# Patient Record
Sex: Male | Born: 1985 | Race: Black or African American | Hispanic: No | Marital: Married | Smoking: Current every day smoker
Health system: Southern US, Community
[De-identification: ages and names within clinical notes are randomized; demographics above are authoritative.]

## PROBLEM LIST (undated history)

## (undated) DIAGNOSIS — J45909 Unspecified asthma, uncomplicated: Secondary | ICD-10-CM

## (undated) DIAGNOSIS — G43909 Migraine, unspecified, not intractable, without status migrainosus: Secondary | ICD-10-CM

---
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
## 2008-09-30 ENCOUNTER — Emergency Department: Payer: Self-pay | Admitting: Emergency Medicine

## 2008-10-19 ENCOUNTER — Emergency Department: Payer: Self-pay | Admitting: Internal Medicine

## 2012-05-23 ENCOUNTER — Emergency Department: Payer: Self-pay | Admitting: Emergency Medicine

## 2012-08-01 ENCOUNTER — Emergency Department: Payer: Self-pay | Admitting: Emergency Medicine

## 2013-10-24 ENCOUNTER — Emergency Department: Payer: Self-pay | Admitting: Emergency Medicine

## 2013-11-09 ENCOUNTER — Emergency Department: Payer: Self-pay | Admitting: Emergency Medicine

## 2014-09-15 ENCOUNTER — Emergency Department
Admission: EM | Admit: 2014-09-15 | Discharge: 2014-09-15 | Disposition: A | Discharge status: Home / Self Care | Payer: Self-pay

## 2014-09-16 ENCOUNTER — Emergency Department
Admission: EM | Admit: 2014-09-16 | Discharge: 2014-09-16 | Disposition: A | Discharge status: Home / Self Care | Payer: PRIVATE HEALTH INSURANCE | Attending: Emergency Medicine | Admitting: Emergency Medicine

## 2014-09-16 ENCOUNTER — Encounter: Payer: Self-pay | Admitting: Emergency Medicine

## 2014-09-16 DIAGNOSIS — L259 Unspecified contact dermatitis, unspecified cause: Secondary | ICD-10-CM | POA: Insufficient documentation

## 2014-09-16 DIAGNOSIS — Z720 Tobacco use: Secondary | ICD-10-CM | POA: Insufficient documentation

## 2014-09-16 MED ORDER — HYDROXYZINE PAMOATE 25 MG PO CAPS: 25.0000 mg | ORAL_CAPSULE | Freq: Three times a day (TID) | ORAL | Status: AC | PRN

## 2014-09-16 MED ORDER — METHYLPREDNISOLONE SODIUM SUCC 125 MG IJ SOLR: 125.0000 mg | Freq: Once | INTRAMUSCULAR | Status: DC

## 2014-09-16 MED ORDER — PREDNISONE 10 MG PO TABS: ORAL_TABLET | ORAL | Status: DC

## 2014-09-16 MED ORDER — METHYLPREDNISOLONE SODIUM SUCC 125 MG IJ SOLR
INTRAMUSCULAR | Status: AC
  Administered 2014-09-16: 125 mg via INTRAMUSCULAR
  Filled 2014-09-16: qty 2

## 2014-09-16 MED ORDER — METHYLPREDNISOLONE SODIUM SUCC 125 MG IJ SOLR
125.0000 mg | Freq: Once | INTRAMUSCULAR | Status: AC
  Administered 2014-09-16: 125 mg via INTRAMUSCULAR

## 2014-09-16 MED ORDER — SODIUM CHLORIDE 0.9 % IV SOLN: 250.0000 mg | Freq: Once | INTRAVENOUS | Status: DC

## 2014-09-16 MED ORDER — RANITIDINE HCL 150 MG PO TABS: 150.0000 mg | ORAL_TABLET | Freq: Two times a day (BID) | ORAL | Status: AC

## 2014-09-16 NOTE — ED Provider Notes (Signed)
[facility] Emergency Department Provider Note   ____________________________________________  Time seen: Approximately 7:22 AM  I have reviewed the triage vital signs and the nursing notes.   HISTORY  Chief Complaint Rash    HPI Frank Hendricks is a 29 y.o. male since with complaints of rash on both his arms 2-3 days ago. States he has been working out in the yard pulling a lot of weeds.   No past medical history on file.  There are no active problems to display for this patient.   No past surgical history on file.  Current Outpatient Rx  Name  Route  Sig  Dispense  Refill  . hydrOXYzine (VISTARIL) 25 MG capsule   Oral   Take 1 capsule (25 mg total) by mouth 3 (three) times daily as needed.   30 capsule   0   . predniSONE (DELTASONE) 10 MG tablet      Take 6 tabs BID x 2 days, then decrease by 1 tablet every 2 days.   42 tablet   0   . ranitidine (ZANTAC) 150 MG tablet   Oral   Take 1 tablet (150 mg total) by mouth 2 (two) times daily.   14 tablet   1     Allergies Review of patient's allergies indicates no known allergies.  No family history on file.  Social History History  Substance Use Topics  . Smoking status: Current Every Day Smoker -- 1.00 packs/day for 12 years    Types: Cigarettes  . Smokeless tobacco: Not on file  . Alcohol Use: No    Review of Systems Constitutional: No fever/chills Eyes: No visual changes. ENT: No sore throat. Cardiovascular: Denies chest pain. Respiratory: Denies shortness of breath. Gastrointestinal: No abdominal pain.  No nausea, no vomiting.  No diarrhea.  No constipation. Genitourinary: Negative for dysuria. Musculoskeletal: Negative for back pain. Skin: Negative for bilateral vesicular rash noted on both arms. Neurological: Negative for headaches, focal weakness or numbness.  10-point ROS otherwise negative.  ____________________________________________   PHYSICAL  EXAM:  VITAL SIGNS: ED Triage Vitals  Enc Vitals Group     BP 09/16/14 0241 124/79 mmHg     Pulse Rate 09/16/14 0241 88     Resp 09/16/14 0241 18     Temp 09/16/14 0241 97.8 F (36.6 C)     Temp Source 09/16/14 0241 Oral     SpO2 09/16/14 0241 100 %     Weight 09/16/14 0241 170 lb (77.111 kg)     Height 09/16/14 0241  (1.778 m)     Head Cir --      Peak Flow --      Pain Score --      Pain Loc --      Pain Edu? --      Excl. in GC? --     Constitutional: Alert and oriented. Well appearing and in no acute distress. Eyes: Conjunctivae are normal. PERRL. EOMI. Head: Atraumatic. Nose: No congestion/rhinnorhea. Mouth/Throat: Mucous membranes are moist.  Oropharynx non-erythematous. Neck: No stridor.   Cardiovascular: Normal rate, regular rhythm. Grossly normal heart sounds.  Good peripheral circulation. Respiratory: Normal respiratory effort.  No retractions. Lungs CTAB. Gastrointestinal: Soft and nontender. No distention. No abdominal bruits. No CVA tenderness. Musculoskeletal: No lower extremity tenderness nor edema.  No joint effusions. Neurologic:  Normal speech and language. No gross focal neurologic deficits are appreciated. Speech is normal. No gait instability. Skin:  Skin is warm, dry and intact. Positive  vesicular rash noted on both arms. No weeping or drainage. No erythema.Marland Kitchen. Psychiatric: Mood and affect are normal. Speech and behavior are normal.  ____________________________________________   LABS (all labs ordered are listed, but only abnormal results are displayed)  Labs Reviewed - No data to display ____________________________________________   ____________________________________________   ____________________________________________   PROCEDURES  Procedure(s) performed: None  Critical Care performed: No  ____________________________________________   INITIAL IMPRESSION / ASSESSMENT AND PLAN / ED COURSE  Pertinent labs & imaging results  that were available during my care of the patient were reviewed by me and considered in my medical decision making (see chart for details).  Symptoms and physical exam consistent with contact dermatitis. We'll treat with prednisone 12 days. Solu-Medrol injection given while in the ER. No other complaints at this time will follow-up if symptoms worsen. ____________________________________________   FINAL CLINICAL IMPRESSION(S) / ED DIAGNOSES  Final diagnoses:  Contact dermatitis      Evangeline DakinCharles M Wyatt Thorstenson, PA-C 09/16/14 1604  Jene Everyobert Kinner, MD 09/18/14 1258

## 2014-09-16 NOTE — ED Notes (Signed)
Has rash on lower arms past few days

## 2014-09-16 NOTE — ED Notes (Signed)
Patient has a rash to bilateral arms and abd that started about 2-3 days ago.

## 2014-09-16 NOTE — Discharge Instructions (Signed)

## 2014-09-22 ENCOUNTER — Emergency Department
Admission: EM | Admit: 2014-09-22 | Discharge: 2014-09-22 | Discharge status: Against Medical Advice | Payer: Self-pay | Attending: Emergency Medicine | Admitting: Emergency Medicine

## 2014-09-22 ENCOUNTER — Encounter: Payer: Self-pay | Admitting: Occupational Medicine

## 2014-09-22 DIAGNOSIS — L237 Allergic contact dermatitis due to plants, except food: Secondary | ICD-10-CM | POA: Insufficient documentation

## 2014-09-22 DIAGNOSIS — Z720 Tobacco use: Secondary | ICD-10-CM | POA: Insufficient documentation

## 2014-09-22 NOTE — ED Notes (Addendum)
Pt presents to ED with poison ivy rash to both arms, both thigh,both ankles and stomach was here earlier this week for the same given shot helped clear some but not all the way. Pt hasn't taken any of the prescript meds from 09/16/14. St doctor said shot was enough.

## 2015-01-26 ENCOUNTER — Encounter: Payer: Self-pay | Admitting: Emergency Medicine

## 2015-01-26 ENCOUNTER — Emergency Department
Admission: EM | Admit: 2015-01-26 | Discharge: 2015-01-26 | Disposition: A | Discharge status: Home / Self Care | Payer: Self-pay | Attending: Emergency Medicine | Admitting: Emergency Medicine

## 2015-01-26 DIAGNOSIS — J209 Acute bronchitis, unspecified: Secondary | ICD-10-CM

## 2015-01-26 DIAGNOSIS — Z720 Tobacco use: Secondary | ICD-10-CM | POA: Insufficient documentation

## 2015-01-26 DIAGNOSIS — Z79899 Other long term (current) drug therapy: Secondary | ICD-10-CM | POA: Insufficient documentation

## 2015-01-26 MED ORDER — ALBUTEROL SULFATE HFA 108 (90 BASE) MCG/ACT IN AERS: 2.0000 | INHALATION_SPRAY | RESPIRATORY_TRACT | Status: DC | PRN

## 2015-01-26 MED ORDER — FLUTICASONE PROPIONATE 50 MCG/ACT NA SUSP: 1.0000 | Freq: Two times a day (BID) | NASAL | Status: AC

## 2015-01-26 MED ORDER — CETIRIZINE HCL 10 MG PO TABS: 10.0000 mg | ORAL_TABLET | Freq: Every day | ORAL | Status: AC

## 2015-01-26 MED ORDER — PREDNISONE 10 MG PO TABS: 10.0000 mg | ORAL_TABLET | ORAL | Status: DC

## 2015-01-26 NOTE — ED Notes (Signed)
Pt states he has been coughing, congested for a week now, states he feels like he needs an inhaler. No wheezing or distress noted.

## 2015-01-26 NOTE — ED Notes (Signed)
States he developed cough and some wheezing last pm with sore throat. No resp distress noted this afternoon

## 2015-01-26 NOTE — Discharge Instructions (Signed)

## 2015-01-26 NOTE — ED Provider Notes (Signed)
[facility] Emergency Department Provider Note  ____________________________________________  Time seen: Approximately 1:43 PM  I have reviewed the triage vital signs and the nursing notes.   HISTORY  Chief Complaint URI    HPI Frank Hendricks is a 29 y.o. male presents to the emergency room with chest tightness, coughing, and nasal congestion. He states that he has had some audible wheezing He has had a history of bronchitis in the past and he states that symptoms are the same. Denies fevers/chills, sore throat, ear pain. In any over-the-counter medications for same.   History reviewed. No pertinent past medical history.  There are no active problems to display for this patient.   History reviewed. No pertinent past surgical history.  Current Outpatient Rx  Name  Route  Sig  Dispense  Refill  . albuterol (PROVENTIL HFA;VENTOLIN HFA) 108 (90 BASE) MCG/ACT inhaler   Inhalation   Inhale 2 puffs into the lungs every 4 (four) hours as needed for wheezing or shortness of breath.   1 Inhaler   1   . cetirizine (ZYRTEC) 10 MG tablet   Oral   Take 1 tablet (10 mg total) by mouth daily.   30 tablet   0   . fluticasone (FLONASE) 50 MCG/ACT nasal spray   Each Nare   Place 1 spray into both nostrils 2 (two) times daily.   16 g   0   . hydrOXYzine (VISTARIL) 25 MG capsule   Oral   Take 1 capsule (25 mg total) by mouth 3 (three) times daily as needed.   30 capsule   0   . predniSONE (DELTASONE) 10 MG tablet   Oral   Take 1 tablet (10 mg total) by mouth as directed.   21 tablet   0     Take on a 6, 5, 4, 3, 2, 1 daily dose   . ranitidine (ZANTAC) 150 MG tablet   Oral   Take 1 tablet (150 mg total) by mouth 2 (two) times daily.   14 tablet   1     Allergies Review of patient's allergies indicates no known allergies.  No family history on file.  Social History Social History  Substance Use Topics  . Smoking status: Current Every Day  Smoker -- 1.00 packs/day for 12 years    Types: Cigarettes  . Smokeless tobacco: None  . Alcohol Use: No    Review of Systems Constitutional: No fever/chills Eyes: No visual changes. ENT: No sore throat. Endorses nasal congestion. Cardiovascular: Denies chest pain. Endorses chest tightness. Respiratory: He endorses mild shortness of breath. He endorses often. Gastrointestinal: No abdominal pain.  No nausea, no vomiting.  No diarrhea.  No constipation. Genitourinary: Negative for dysuria. Musculoskeletal: Negative for back pain. Skin: Negative for rash. Neurological: Negative for headaches, focal weakness or numbness.  10-point ROS otherwise negative.  ____________________________________________   PHYSICAL EXAM:  VITAL SIGNS: ED Triage Vitals  Enc Vitals Group     BP 01/26/15 1306 113/66 mmHg     Pulse --      Resp 01/26/15 1306 18     Temp 01/26/15 1306 97.8 F (36.6 C)     Temp Source 01/26/15 1306 Oral     SpO2 01/26/15 1306 100 %     Weight 01/26/15 1306 165 lb (74.844 kg)     Height 01/26/15 1306  (1.778 m)     Head Cir --      Peak Flow --  Pain Score --      Pain Loc --      Pain Edu? --      Excl. in GC? --     Constitutional: Alert and oriented. Well appearing and in no acute distress. Eyes: Conjunctivae are normal. PERRL. EOMI. Head: Atraumatic. Nose: Moderate clear congestion/rhinnorhea. Mouth/Throat: Mucous membranes are moist.  Oropharynx non-erythematous. Neck: No stridor.   Hematological/Lymphatic/Immunilogical: Mild, nontender, mobile anterior cervical lymphadenopathy. Cardiovascular: Normal rate, regular rhythm. Grossly normal heart sounds.  Good peripheral circulation. Respiratory: Normal respiratory effort.  No retractions.  Diffuse wheezing to bilateral lungs, all fields. Gastrointestinal: Soft and nontender. No distention. No abdominal bruits. No CVA tenderness. Musculoskeletal: No lower extremity tenderness nor edema.  No joint  effusions. Neurologic:  Normal speech and language. No gross focal neurologic deficits are appreciated. No gait instability. Skin:  Skin is warm, dry and intact. No rash noted. Psychiatric: Mood and affect are normal. Speech and behavior are normal.  ____________________________________________   LABS (all labs ordered are listed, but only abnormal results are displayed)  Labs Reviewed - No data to display ____________________________________________  EKG   ____________________________________________  RADIOLOGY   ____________________________________________   PROCEDURES  Procedure(s) performed: None  Critical Care performed: No  ____________________________________________   INITIAL IMPRESSION / ASSESSMENT AND PLAN / ED COURSE  Pertinent labs & imaging results that were available during my care of the patient were reviewed by me and considered in my medical decision making (see chart for details).  The patient's history, symptoms, exam are most consistent with the finding of acute bronchitis. Patient will be placed on prednisone taper, albuterol inhaler, Zyrtec, and Flonase for symptom relief. Advised patient of diagnosis and he verbalized understanding and agreement. Advised patient of treatment plan and he verbalized understanding and compliance. Patient to follow-up with emergency Department should symptoms worsen, chest pain occur, or difficulty breathing. ____________________________________________   FINAL CLINICAL IMPRESSION(S) / ED DIAGNOSES  Final diagnoses:  Acute bronchitis, unspecified organism      Racheal Patches, PA-C 01/26/15 1354  Jeanmarie Plant, MD 01/26/15 1550

## 2015-04-30 LAB — HM HIV SCREENING LAB: HM HIV Screening: NEGATIVE

## 2015-06-21 ENCOUNTER — Encounter: Payer: Self-pay | Admitting: Emergency Medicine

## 2015-06-21 DIAGNOSIS — Z79899 Other long term (current) drug therapy: Secondary | ICD-10-CM | POA: Insufficient documentation

## 2015-06-21 DIAGNOSIS — F17210 Nicotine dependence, cigarettes, uncomplicated: Secondary | ICD-10-CM | POA: Insufficient documentation

## 2015-06-21 DIAGNOSIS — Z7951 Long term (current) use of inhaled steroids: Secondary | ICD-10-CM | POA: Insufficient documentation

## 2015-06-21 DIAGNOSIS — G43909 Migraine, unspecified, not intractable, without status migrainosus: Secondary | ICD-10-CM | POA: Insufficient documentation

## 2015-06-21 NOTE — ED Notes (Signed)
Pt states headache for 4 days. Pt states has photophobia and nausea, no vomiting. Pt appears in no acute distress in triage. Pt denies fever, pt states history of migraines. Pt states 'this feels like a migraine."perrl 3mm and brisk.

## 2015-06-22 ENCOUNTER — Emergency Department
Admission: EM | Admit: 2015-06-22 | Discharge: 2015-06-22 | Disposition: A | Discharge status: Home / Self Care | Payer: Self-pay | Attending: Emergency Medicine | Admitting: Emergency Medicine

## 2015-06-22 DIAGNOSIS — G43009 Migraine without aura, not intractable, without status migrainosus: Secondary | ICD-10-CM

## 2015-06-22 HISTORY — DX: Migraine, unspecified, not intractable, without status migrainosus: G43.909

## 2015-06-22 MED ORDER — METOCLOPRAMIDE HCL 5 MG/ML IJ SOLN
10.0000 mg | INTRAMUSCULAR | Status: AC
  Administered 2015-06-22: 10 mg via INTRAVENOUS
  Filled 2015-06-22: qty 2

## 2015-06-22 MED ORDER — DIPHENHYDRAMINE HCL 50 MG/ML IJ SOLN
25.0000 mg | Freq: Once | INTRAMUSCULAR | Status: AC
  Administered 2015-06-22: 25 mg via INTRAVENOUS
  Filled 2015-06-22: qty 1

## 2015-06-22 MED ORDER — KETOROLAC TROMETHAMINE 30 MG/ML IJ SOLN
15.0000 mg | Freq: Once | INTRAMUSCULAR | Status: AC
  Administered 2015-06-22: 15 mg via INTRAVENOUS
  Filled 2015-06-22: qty 1

## 2015-06-22 MED ORDER — DEXAMETHASONE SODIUM PHOSPHATE 10 MG/ML IJ SOLN
10.0000 mg | Freq: Once | INTRAMUSCULAR | Status: AC
  Administered 2015-06-22: 10 mg via INTRAVENOUS
  Filled 2015-06-22: qty 1

## 2015-06-22 MED ORDER — SODIUM CHLORIDE 0.9 % IV BOLUS (SEPSIS)
500.0000 mL | INTRAVENOUS | Status: AC
  Administered 2015-06-22: 500 mL via INTRAVENOUS

## 2015-06-22 NOTE — ED Provider Notes (Signed)
[facility] Emergency Department Provider Note  ____________________________________________  Time seen: Approximately 1:41 AM  I have reviewed the triage vital signs and the nursing notes.   HISTORY  Chief Complaint Headache    HPI Frank Hendricks is a 30 y.o. male who reports a past medical history of migraines for years who presents with a migraine that has been persistent for the last 3-4 days.  He describes the pain as moderate in severity and a generalized aching and throbbing.  He reports that it was gradual in onset and feels similar to his past migraines.  It is at times generalized and at times only on the right side of his head.  He has photophobia and nausea.  He threw up a couple times today but that has improved.  He takes ibuprofen but it has not been helping.  Nothing particular seems to make it worse.  He has no neck pain or stiffness.  Other than the photophobia he has no visual symptoms.  He denies any recent illnesses such as URIs and specifically denies fever/chills, chest pain, shortness of breath, cough, abdominal pain, dysuria.   Past Medical History  Diagnosis Date  . Migraines     There are no active problems to display for this patient.   No past surgical history on file.  Current Outpatient Rx  Name  Route  Sig  Dispense  Refill  . albuterol (PROVENTIL HFA;VENTOLIN HFA) 108 (90 BASE) MCG/ACT inhaler   Inhalation   Inhale 2 puffs into the lungs every 4 (four) hours as needed for wheezing or shortness of breath.   1 Inhaler   1   . cetirizine (ZYRTEC) 10 MG tablet   Oral   Take 1 tablet (10 mg total) by mouth daily.   30 tablet   0   . fluticasone (FLONASE) 50 MCG/ACT nasal spray   Each Nare   Place 1 spray into both nostrils 2 (two) times daily.   16 g   0   . hydrOXYzine (VISTARIL) 25 MG capsule   Oral   Take 1 capsule (25 mg total) by mouth 3 (three) times daily as needed.   30 capsule   0   . predniSONE  (DELTASONE) 10 MG tablet   Oral   Take 1 tablet (10 mg total) by mouth as directed.   21 tablet   0     Take on a 6, 5, 4, 3, 2, 1 daily dose   . ranitidine (ZANTAC) 150 MG tablet   Oral   Take 1 tablet (150 mg total) by mouth 2 (two) times daily.   14 tablet   1     Allergies Review of patient's allergies indicates no known allergies.  No family history on file.  Social History Social History  Substance Use Topics  . Smoking status: Current Every Day Smoker -- 1.00 packs/day for 12 years    Types: Cigarettes  . Smokeless tobacco: Never Used  . Alcohol Use: No    Review of Systems Constitutional: No fever/chills Eyes: No visual changes but he does have photophobia ENT: No sore throat. Cardiovascular: Denies chest pain. Respiratory: Denies shortness of breath. Gastrointestinal: No abdominal pain.  Nausea and several episodes of vomiting.  No diarrhea.  No constipation. Genitourinary: Negative for dysuria. Musculoskeletal: Negative for back pain. Skin: Negative for rash. Neurological: Global headache and occasionally just right-sided headache for 3-4 days  10-point ROS otherwise negative.  ____________________________________________   PHYSICAL EXAM:  VITAL SIGNS:  ED Triage Vitals  Enc Vitals Group     BP 06/21/15 2355 120/68 mmHg     Pulse Rate 06/21/15 2355 71     Resp 06/21/15 2355 16     Temp 06/21/15 2355 98 F (36.7 C)     Temp Source 06/21/15 2355 Oral     SpO2 06/21/15 2355 100 %     Weight 06/21/15 2355 170 lb (77.111 kg)     Height 06/21/15 2355  (1.753 m)     Head Cir --      Peak Flow --      Pain Score 06/21/15 2356 5     Pain Loc --      Pain Edu? --      Excl. in GC? --     Constitutional: Alert and oriented. Well appearing and in no acute distress. Eyes: Conjunctivae are normal. PERRL. EOMI. no papilledema on funduscopic exam. Head: Atraumatic. Nose: No congestion/rhinnorhea. Mouth/Throat: Mucous membranes are moist.   Oropharynx non-erythematous. Neck: No stridor.  No meningeal signs Cardiovascular: Normal rate, regular rhythm. Grossly normal heart sounds.  Good peripheral circulation. Respiratory: Normal respiratory effort.  No retractions. Lungs CTAB. Gastrointestinal: Soft and nontender. No distention. No abdominal bruits. No CVA tenderness. Musculoskeletal: No lower extremity tenderness nor edema.  No joint effusions. Neurologic:  Normal speech and language. No gross focal neurologic deficits are appreciated.  Skin:  Skin is warm, dry and intact. No rash noted. Psychiatric: Mood and affect are normal. Speech and behavior are normal.  ____________________________________________   LABS (all labs ordered are listed, but only abnormal results are displayed)  Labs Reviewed - No data to display ____________________________________________  EKG  None ____________________________________________  RADIOLOGY   No results found.  ____________________________________________   PROCEDURES  Procedure(s) performed: None  Critical Care performed: None  ____________________________________________   INITIAL IMPRESSION / ASSESSMENT AND PLAN / ED COURSE  Pertinent labs & imaging results that were available during my care of the patient were reviewed by me and considered in my medical decision making (see chart for details).  Signs/symptoms most consistent with migraine.  No indication of more severe intracranial injury/condition.  No neurological deficits.  Will treat empirically as migraine and reassess.  ----------------------------------------- 3:47 AM on 06/22/2015 -----------------------------------------  The patient is sleeping soundly and comfortably.  I woke him up and he said his migraine has resolved and he is ready to go home.  I gave my usual and customary return precautions.     ____________________________________________  FINAL CLINICAL IMPRESSION(S) / ED  DIAGNOSES  Final diagnoses:  Nonintractable migraine, unspecified migraine type      NEW MEDICATIONS STARTED DURING THIS VISIT:  New Prescriptions   No medications on file      Note:  This document was prepared using Dragon voice recognition software and may include unintentional dictation errors.   Frank Rose, MD 06/22/15 [phone]

## 2015-06-22 NOTE — Discharge Instructions (Signed)
You have been seen in the Emergency Department (ED) for a migraine.  Please use Tylenol or Motrin as needed for symptoms, but only as written on the box, and take any regular medications that have been prescribed for you. °As we have discussed, please follow up with your doctor as soon as possible regarding today’s ED visit and your headache symptoms.   ° °Call your doctor or return to the Emergency Department (ED) if you have a worsening headache, sudden and severe headache, confusion, slurred speech, facial droop, weakness or numbness in any arm or leg, extreme fatigue, or other symptoms that concern you. ° ° °Migraine Headache °A migraine headache is an intense, throbbing pain on one or both sides of your head. A migraine can last for 30 minutes to several hours. °CAUSES  °The exact cause of a migraine headache is not always known. However, a migraine may be caused when nerves in the brain become irritated and release chemicals that cause inflammation. This causes pain. °Certain things may also trigger migraines, such as: °· Alcohol. °· Smoking. °· Stress. °· Menstruation. °· Aged cheeses. °· Foods or drinks that contain nitrates, glutamate, aspartame, or tyramine. °· Lack of sleep. °· Chocolate. °· Caffeine. °· Hunger. °· Physical exertion. °· Fatigue. °· Medicines used to treat chest pain (nitroglycerine), birth control pills, estrogen, and some blood pressure medicines. °SIGNS AND SYMPTOMS °· Pain on one or both sides of your head. °· Pulsating or throbbing pain. °· Severe pain that prevents daily activities. °· Pain that is aggravated by any physical activity. °· Nausea, vomiting, or both. °· Dizziness. °· Pain with exposure to bright lights, loud noises, or activity. °· General sensitivity to bright lights, loud noises, or smells. °Before you get a migraine, you may get warning signs that a migraine is coming (aura). An aura may include: °· Seeing flashing lights. °· Seeing bright spots, halos, or zigzag  lines. °· Having tunnel vision or blurred vision. °· Having feelings of numbness or tingling. °· Having trouble talking. °· Having muscle weakness. °DIAGNOSIS  °A migraine headache is often diagnosed based on: °· Symptoms. °· Physical exam. °· A CT scan or MRI of your head. These imaging tests cannot diagnose migraines, but they can help rule out other causes of headaches. °TREATMENT °Medicines may be given for pain and nausea. Medicines can also be given to help prevent recurrent migraines.  °HOME CARE INSTRUCTIONS °· Only take over-the-counter or prescription medicines for pain or discomfort as directed by your health care provider. The use of long-term narcotics is not recommended. °· Lie down in a dark, quiet room when you have a migraine. °· Keep a journal to find out what may trigger your migraine headaches. For example, write down: °¨ What you eat and drink. °¨ How much sleep you get. °¨ Any change to your diet or medicines. °· Limit alcohol consumption. °· Quit smoking if you smoke. °· Get 7-9 hours of sleep, or as recommended by your health care provider. °· Limit stress. °· Keep lights dim if bright lights bother you and make your migraines worse. °SEEK IMMEDIATE MEDICAL CARE IF:  °· Your migraine becomes severe. °· You have a fever. °· You have a stiff neck. °· You have vision loss. °· You have muscular weakness or loss of muscle control. °· You start losing your balance or have trouble walking. °· You feel faint or pass out. °· You have severe symptoms that are different from your first symptoms. °MAKE SURE YOU:  °·   Understand these instructions. °· Will watch your condition. °· Will get help right away if you are not doing well or get worse. °  °This information is not intended to replace advice given to you by your health care provider. Make sure you discuss any questions you have with your health care provider. °  °Document Released: 04/12/2005 Document Revised: 05/03/2014 Document Reviewed:  12/18/2012 °Elsevier Interactive Patient Education ©2016 [employer] ° ° ° °

## 2015-06-22 NOTE — ED Notes (Signed)
Pt discharged to home.  Family member driving.  Discharge instructions reviewed.  Verbalized understanding.  No questions or concerns at this time.  Teach back verified.  Pt in NAD.  No items left in ED.   

## 2015-06-22 NOTE — ED Notes (Signed)
Pt states that he feels much better at this time.   

## 2016-05-10 ENCOUNTER — Encounter: Payer: Self-pay | Admitting: Emergency Medicine

## 2016-05-10 ENCOUNTER — Emergency Department
Admission: EM | Admit: 2016-05-10 | Discharge: 2016-05-10 | Disposition: A | Discharge status: Home / Self Care | Payer: Self-pay | Attending: Emergency Medicine | Admitting: Emergency Medicine

## 2016-05-10 ENCOUNTER — Emergency Department: Payer: Self-pay

## 2016-05-10 DIAGNOSIS — Z79899 Other long term (current) drug therapy: Secondary | ICD-10-CM | POA: Insufficient documentation

## 2016-05-10 DIAGNOSIS — J9801 Acute bronchospasm: Secondary | ICD-10-CM | POA: Insufficient documentation

## 2016-05-10 DIAGNOSIS — F17210 Nicotine dependence, cigarettes, uncomplicated: Secondary | ICD-10-CM | POA: Insufficient documentation

## 2016-05-10 MED ORDER — ALBUTEROL SULFATE HFA 108 (90 BASE) MCG/ACT IN AERS: 2.0000 | INHALATION_SPRAY | Freq: Four times a day (QID) | RESPIRATORY_TRACT | 0 refills | Status: DC | PRN

## 2016-05-10 MED ORDER — PSEUDOEPH-BROMPHEN-DM 30-2-10 MG/5ML PO SYRP: 5.0000 mL | ORAL_SOLUTION | Freq: Four times a day (QID) | ORAL | 0 refills | Status: AC | PRN

## 2016-05-10 MED ORDER — METHYLPREDNISOLONE 4 MG PO TBPK: ORAL_TABLET | ORAL | 0 refills | Status: AC

## 2016-05-10 MED ORDER — IPRATROPIUM-ALBUTEROL 0.5-2.5 (3) MG/3ML IN SOLN
3.0000 mL | Freq: Once | RESPIRATORY_TRACT | Status: AC
  Administered 2016-05-10: 3 mL via RESPIRATORY_TRACT
  Filled 2016-05-10: qty 3

## 2016-05-10 NOTE — ED Notes (Signed)
See triage note   States he has had some pressure in chest with some diff breathing  States non prod cough   No fever  resp even and non labored

## 2016-05-10 NOTE — ED Provider Notes (Signed)
[facility] Emergency Department Provider Note   ____________________________________________   First MD Initiated Contact with Patient 05/10/16 1241     (approximate)  I have reviewed the triage vital signs and the nursing notes.   HISTORY  Chief Complaint Cough    HPI SAM OVERBECK is a 31 y.o. male patient presents today complaining of 2 days of tightness in his chest productive cough and nasal congestion. Patient requesting inhaler but does not have a history of asthma. Patient denies any fever or chills. No palliative measures taken for this complaint.Patient denies any pain with this complaint.  Past Medical History:  Diagnosis Date  . Migraines     There are no active problems to display for this patient.   History reviewed. No pertinent surgical history.  Prior to Admission medications   Medication Sig Start Date End Date Taking? Authorizing Provider  albuterol (PROVENTIL HFA;VENTOLIN HFA) 108 (90 BASE) MCG/ACT inhaler Inhale 2 puffs into the lungs every 4 (four) hours as needed for wheezing or shortness of breath. 01/26/15   Delorise Royals Cuthriell, PA-C  albuterol (PROVENTIL HFA;VENTOLIN HFA) 108 (90 Base) MCG/ACT inhaler Inhale 2 puffs into the lungs every 6 (six) hours as needed for wheezing or shortness of breath. 05/10/16   Joni Reining, PA-C  brompheniramine-pseudoephedrine-DM 30-2-10 MG/5ML syrup Take 5 mLs by mouth 4 (four) times daily as needed. 05/10/16   Joni Reining, PA-C  cetirizine (ZYRTEC) 10 MG tablet Take 1 tablet (10 mg total) by mouth daily. 01/26/15   Delorise Royals Cuthriell, PA-C  fluticasone (FLONASE) 50 MCG/ACT nasal spray Place 1 spray into both nostrils 2 (two) times daily. 01/26/15   Delorise Royals Cuthriell, PA-C  hydrOXYzine (VISTARIL) 25 MG capsule Take 1 capsule (25 mg total) by mouth 3 (three) times daily as needed. 09/16/14   Evangeline Dakin, PA-C  methylPREDNISolone (MEDROL DOSEPAK) 4 MG TBPK tablet Take Tapered dose as  directed 05/10/16   Joni Reining, PA-C  predniSONE (DELTASONE) 10 MG tablet Take 1 tablet (10 mg total) by mouth as directed. 01/26/15   Delorise Royals Cuthriell, PA-C  ranitidine (ZANTAC) 150 MG tablet Take 1 tablet (150 mg total) by mouth 2 (two) times daily. 09/16/14   Evangeline Dakin, PA-C    Allergies Patient has no known allergies.  No family history on file.  Social History Social History  Substance Use Topics  . Smoking status: Current Every Day Smoker    Packs/day: 1.50    Years: 12.00    Types: Cigarettes  . Smokeless tobacco: Never Used  . Alcohol use No    Review of Systems Constitutional: No fever/chills Eyes: No visual changes. ENT: No sore throat. Cardiovascular: Denies chest pain. Respiratory: Chest tightness and wheezing. Gastrointestinal: No abdominal pain.  No nausea, no vomiting.  No diarrhea.  No constipation. Genitourinary: Negative for dysuria. Musculoskeletal: Negative for back pain. Skin: Negative for rash. Neurological: Negative for headaches, focal weakness or numbness.  10-point ROS otherwise negative ____________________________________________   PHYSICAL EXAM:  VITAL SIGNS: ED Triage Vitals [05/10/16 1225]  Enc Vitals Group     BP 122/83     Pulse Rate 73     Resp 20     Temp 97.4 F (36.3 C)     Temp Source Oral     SpO2 99 %     Weight 160 lb (72.6 kg)     Height 5\' 9"  (1.753 m)     Head Circumference  Peak Flow      Pain Score      Pain Loc      Pain Edu?      Excl. in GC?     Constitutional: Alert and oriented. Well appearing and in no acute distress. Eyes: Conjunctivae are normal. PERRL. EOMI. Head: Atraumatic. Nose: No congestion/rhinnorhea. Mouth/Throat: Mucous membranes are moist.  Oropharynx non-erythematous. Neck: No stridor.  No cervical spine tenderness to palpation. Hematological/Lymphatic/Immunilogical: No cervical lymphadenopathy. Cardiovascular: Normal rate, regular rhythm. Grossly normal heart sounds.   Good peripheral circulation. Respiratory: Normal respiratory effort.  No retractions. Lungs bilateral Rales and wheezing. Gastrointestinal: Soft and nontender. No distention. No abdominal bruits. No CVA tenderness. Musculoskeletal: No lower extremity tenderness nor edema.  No joint effusions. Neurologic:  Normal speech and language. No gross focal neurologic deficits are appreciated. No gait instability. Skin:  Skin is warm, dry and intact. No rash noted. Psychiatric: Mood and affect are normal. Speech and behavior are normal.  ____________________________________________   LABS (all labs ordered are listed, but only abnormal results are displayed)  Labs Reviewed - No data to display ____________________________________________  EKG   ____________________________________________  RADIOLOGY  No acute findings on x-ray of the chest. ____________________________________________   PROCEDURES  Procedure(s) performed: None  Procedures  Critical Care performed: No  ____________________________________________   INITIAL IMPRESSION / ASSESSMENT AND PLAN / ED COURSE  Pertinent labs & imaging results that were available during my care of the patient were reviewed by me and considered in my medical decision making (see chart for details).  Cough secondary to bronchospasms. Patient given discharge care instructions. Patient given a prescription for Bromfed-DM, Medrol Dosepak, and albuterol. Patient advised follow "clinic if condition persists.  Clinical Course    Discussed x-ray finding with patient. Patient given  DuoNeb treatment prior to departure.  ____________________________________________   FINAL CLINICAL IMPRESSION(S) / ED DIAGNOSES  Final diagnoses:  Cough due to bronchospasm      NEW MEDICATIONS STARTED DURING THIS VISIT:  New Prescriptions   ALBUTEROL (PROVENTIL HFA;VENTOLIN HFA) 108 (90 BASE) MCG/ACT INHALER    Inhale 2 puffs into the lungs every 6 (six)  hours as needed for wheezing or shortness of breath.   BROMPHENIRAMINE-PSEUDOEPHEDRINE-DM 30-2-10 MG/5ML SYRUP    Take 5 mLs by mouth 4 (four) times daily as needed.   METHYLPREDNISOLONE (MEDROL DOSEPAK) 4 MG TBPK TABLET    Take Tapered dose as directed     Note:  This document was prepared using Dragon voice recognition software and may include unintentional dictation errors.    Joni Reiningonald K Smith, PA-C 05/10/16 1331    Jeanmarie PlantJames A McShane, MD 05/10/16 [phone]

## 2016-05-10 NOTE — ED Triage Notes (Signed)
Tight cough today, speaking full sentences, resp unlabored, cough productive clear.

## 2016-06-27 ENCOUNTER — Emergency Department
Admission: EM | Admit: 2016-06-27 | Discharge: 2016-06-27 | Disposition: A | Discharge status: Home / Self Care | Payer: Self-pay | Attending: Emergency Medicine | Admitting: Emergency Medicine

## 2016-06-27 ENCOUNTER — Emergency Department: Payer: Self-pay

## 2016-06-27 ENCOUNTER — Encounter: Payer: Self-pay | Admitting: Emergency Medicine

## 2016-06-27 DIAGNOSIS — J45901 Unspecified asthma with (acute) exacerbation: Secondary | ICD-10-CM | POA: Insufficient documentation

## 2016-06-27 DIAGNOSIS — J4521 Mild intermittent asthma with (acute) exacerbation: Secondary | ICD-10-CM

## 2016-06-27 DIAGNOSIS — F17210 Nicotine dependence, cigarettes, uncomplicated: Secondary | ICD-10-CM | POA: Insufficient documentation

## 2016-06-27 HISTORY — DX: Unspecified asthma, uncomplicated: J45.909

## 2016-06-27 MED ORDER — IPRATROPIUM-ALBUTEROL 0.5-2.5 (3) MG/3ML IN SOLN
RESPIRATORY_TRACT | Status: AC
  Filled 2016-06-27: qty 3

## 2016-06-27 MED ORDER — ALBUTEROL SULFATE HFA 108 (90 BASE) MCG/ACT IN AERS: 2.0000 | INHALATION_SPRAY | Freq: Four times a day (QID) | RESPIRATORY_TRACT | 0 refills | Status: DC | PRN

## 2016-06-27 MED ORDER — ALBUTEROL SULFATE (2.5 MG/3ML) 0.083% IN NEBU
INHALATION_SOLUTION | RESPIRATORY_TRACT | Status: AC
  Filled 2016-06-27: qty 3

## 2016-06-27 MED ORDER — IPRATROPIUM-ALBUTEROL 0.5-2.5 (3) MG/3ML IN SOLN
3.0000 mL | Freq: Once | RESPIRATORY_TRACT | Status: AC
  Administered 2016-06-27: 3 mL via RESPIRATORY_TRACT
  Filled 2016-06-27: qty 3

## 2016-06-27 MED ORDER — IPRATROPIUM-ALBUTEROL 0.5-2.5 (3) MG/3ML IN SOLN
3.0000 mL | Freq: Once | RESPIRATORY_TRACT | Status: AC
  Administered 2016-06-27: 3 mL via RESPIRATORY_TRACT

## 2016-06-27 MED ORDER — ALBUTEROL SULFATE (2.5 MG/3ML) 0.083% IN NEBU
2.5000 mg | INHALATION_SOLUTION | Freq: Once | RESPIRATORY_TRACT | Status: AC
  Administered 2016-06-27: 2.5 mg via RESPIRATORY_TRACT

## 2016-06-27 NOTE — ED Triage Notes (Signed)
Wheezing , cough , congestion,  Was here for same approx x1 month ago RX for inhaler given

## 2016-06-27 NOTE — ED Provider Notes (Signed)
[facility] Emergency Department Provider Note  ____________________________________________  Time seen: Approximately 11:52 AM  I have reviewed the triage vital signs and the nursing notes.   HISTORY  Chief Complaint Asthma    HPI Frank Hendricks is a 31 y.o. male that presents to the emergency department with wheezing and shortness of breath for 1 day. Patient states that he uses an albuterol inhaler for asthma and ran out yesterday. Inhaler helps symptoms. When he uses his inhaler, his asthma does not flareup. He denies fever, headache, visual changes, cough, chest pain, nausea, vomiting, abdominal pain.   Past Medical History:  Diagnosis Date  . Asthma   . Migraines     There are no active problems to display for this patient.   History reviewed. No pertinent surgical history.  Prior to Admission medications   Medication Sig Start Date End Date Taking? Authorizing Provider  albuterol (PROVENTIL HFA;VENTOLIN HFA) 108 (90 Base) MCG/ACT inhaler Inhale 2 puffs into the lungs every 6 (six) hours as needed for wheezing or shortness of breath. 06/27/16   Enid Derry, PA-C  brompheniramine-pseudoephedrine-DM 30-2-10 MG/5ML syrup Take 5 mLs by mouth 4 (four) times daily as needed. 05/10/16   Joni Reining, PA-C  cetirizine (ZYRTEC) 10 MG tablet Take 1 tablet (10 mg total) by mouth daily. 01/26/15   Delorise Royals Cuthriell, PA-C  fluticasone (FLONASE) 50 MCG/ACT nasal spray Place 1 spray into both nostrils 2 (two) times daily. 01/26/15   Delorise Royals Cuthriell, PA-C  hydrOXYzine (VISTARIL) 25 MG capsule Take 1 capsule (25 mg total) by mouth 3 (three) times daily as needed. 09/16/14   Evangeline Dakin, PA-C  methylPREDNISolone (MEDROL DOSEPAK) 4 MG TBPK tablet Take Tapered dose as directed 05/10/16   Joni Reining, PA-C  predniSONE (DELTASONE) 10 MG tablet Take 1 tablet (10 mg total) by mouth as directed. 01/26/15   Delorise Royals Cuthriell, PA-C  ranitidine (ZANTAC) 150 MG  tablet Take 1 tablet (150 mg total) by mouth 2 (two) times daily. 09/16/14   Evangeline Dakin, PA-C    Allergies Patient has no known allergies.  No family history on file.  Social History Social History  Substance Use Topics  . Smoking status: Current Every Day Smoker    Packs/day: 1.50    Years: 12.00    Types: Cigarettes  . Smokeless tobacco: Never Used  . Alcohol use No     Review of Systems  Constitutional: No fever/chills ENT: No upper respiratory complaints. Cardiovascular: No chest pain. Respiratory: No cough.  Gastrointestinal: No abdominal pain.  No nausea, no vomiting.  Skin: Negative for rash, abrasions, lacerations, ecchymosis. Neurological: Negative for headaches, numbness or tingling   ____________________________________________   PHYSICAL EXAM:  VITAL SIGNS: ED Triage Vitals  Enc Vitals Group     BP 06/27/16 1034 (!) 112/92     Pulse Rate 06/27/16 1034 86     Resp 06/27/16 1034 18     Temp 06/27/16 1034 97.6 F (36.4 C)     Temp Source 06/27/16 1034 Oral     SpO2 06/27/16 1034 97 %     Weight 06/27/16 1035 170 lb (77.1 kg)     Height 06/27/16 1035 5\' 10"  (1.778 m)     Head Circumference --      Peak Flow --      Pain Score 06/27/16 1035 7     Pain Loc --      Pain Edu? --      Excl.  in GC? --      Constitutional: Alert and oriented. Well appearing and in no acute distress. Eyes: Conjunctivae are normal. PERRL. EOMI. Head: Atraumatic. ENT:      Ears:      Nose: No congestion/rhinnorhea.      Mouth/Throat: Mucous membranes are moist.  Neck: No stridor.   Cardiovascular: Normal rate, regular rhythm.  Good peripheral circulation. Respiratory: Normal respiratory effort without tachypnea or retractions. Scattered wheezes. Good air entry to the bases with no decreased or absent breath sounds. Gastrointestinal: Bowel sounds 4 quadrants. Soft and nontender to palpation.  Musculoskeletal: Full range of motion to all extremities. No gross  deformities appreciated. Neurologic:  Normal speech and language. No gross focal neurologic deficits are appreciated.  Skin:  Skin is warm, dry and intact. No rash noted.   ____________________________________________   LABS (all labs ordered are listed, but only abnormal results are displayed)  Labs Reviewed - No data to display ____________________________________________  EKG   ____________________________________________  RADIOLOGY Lexine Baton, personally viewed and evaluated these images (plain radiographs) as part of my medical decision making, as well as reviewing the written report by the radiologist.  Dg Chest 2 View  Result Date: 06/27/2016 CLINICAL DATA:  Wheezing and cough.  Shortness of breath. EXAM: CHEST  2 VIEW COMPARISON:  May 10, 2016 FINDINGS: The heart size and mediastinal contours are within normal limits. Both lungs are clear. The visualized skeletal structures are unremarkable. IMPRESSION: No active cardiopulmonary disease. Electronically Signed   By: Gerome Sam III M.D   On: 06/27/2016 12:35    ____________________________________________    PROCEDURES  Procedure(s) performed:    Procedures    Medications  ipratropium-albuterol (DUONEB) 0.5-2.5 (3) MG/3ML nebulizer solution 3 mL (3 mLs Nebulization Given 06/27/16 1100)  ipratropium-albuterol (DUONEB) 0.5-2.5 (3) MG/3ML nebulizer solution 3 mL (3 mLs Nebulization Given 06/27/16 1140)  albuterol (PROVENTIL) (2.5 MG/3ML) 0.083% nebulizer solution 2.5 mg (2.5 mg Nebulization Given 06/27/16 1242)     ____________________________________________   INITIAL IMPRESSION / ASSESSMENT AND PLAN / ED COURSE  Pertinent labs & imaging results that were available during my care of the patient were reviewed by me and considered in my medical decision making (see chart for details).  Review of the [place] CSRS was performed in accordance of the [facility] prior to dispensing any controlled drugs.      Patient's diagnosis is consistent with asthma exacerbation. Vital signs and exam are reassuring. Patient received 2 DuoNebs and 1 albuterol nebulizer in ED. Wheezing and shortness of breath improved with treatment. She felt good before leaving ED. Patient states that albuterol helps prevent this but he was just out of his inhaler. Patient was encouraged to establish care with primary care provider so that he does not run out of albuterol inhalers in the future. Patient will be discharged home with prescriptions for albuterol inhaler. Patient is to follow up with PCP as directed. Patient is given ED precautions to return to the ED for any worsening or new symptoms.     ____________________________________________  FINAL CLINICAL IMPRESSION(S) / ED DIAGNOSES  Final diagnoses:  Exacerbation of intermittent asthma, unspecified asthma severity      NEW MEDICATIONS STARTED DURING THIS VISIT:  Discharge Medication List as of 06/27/2016  1:04 PM          This chart was dictated using voice recognition software/Dragon. Despite best efforts to proofread, errors can occur which can change the meaning. Any change was purely unintentional.    Morrie Sheldon  Loreta AveWagner, PA-C 06/27/16 1659    Sharman CheekPhillip Stafford, MD 07/02/16 1524

## 2016-08-19 ENCOUNTER — Emergency Department: Payer: Self-pay

## 2016-08-19 ENCOUNTER — Emergency Department
Admission: EM | Admit: 2016-08-19 | Discharge: 2016-08-19 | Disposition: A | Discharge status: Home / Self Care | Payer: Self-pay | Attending: Emergency Medicine | Admitting: Emergency Medicine

## 2016-08-19 DIAGNOSIS — J189 Pneumonia, unspecified organism: Secondary | ICD-10-CM | POA: Insufficient documentation

## 2016-08-19 DIAGNOSIS — F17210 Nicotine dependence, cigarettes, uncomplicated: Secondary | ICD-10-CM | POA: Insufficient documentation

## 2016-08-19 DIAGNOSIS — J45901 Unspecified asthma with (acute) exacerbation: Secondary | ICD-10-CM | POA: Insufficient documentation

## 2016-08-19 DIAGNOSIS — Z79899 Other long term (current) drug therapy: Secondary | ICD-10-CM | POA: Insufficient documentation

## 2016-08-19 DIAGNOSIS — J45909 Unspecified asthma, uncomplicated: Secondary | ICD-10-CM | POA: Insufficient documentation

## 2016-08-19 LAB — CBC WITH DIFFERENTIAL/PLATELET
BASOS PCT: 0 %
Basophils Absolute: 0 10*3/uL (ref 0–0.1)
Eosinophils Absolute: 0.4 10*3/uL (ref 0–0.7)
Eosinophils Relative: 3 %
HCT: 43.9 % (ref 40.0–52.0)
HEMOGLOBIN: 14.9 g/dL (ref 13.0–18.0)
LYMPHS PCT: 6 %
Lymphs Abs: 0.8 10*3/uL — ABNORMAL LOW (ref 1.0–3.6)
MCH: 30.5 pg (ref 26.0–34.0)
MCHC: 33.9 g/dL (ref 32.0–36.0)
MCV: 89.9 fL (ref 80.0–100.0)
MONO ABS: 1.1 10*3/uL — AB (ref 0.2–1.0)
MONOS PCT: 8 %
NEUTROS ABS: 12.1 10*3/uL — AB (ref 1.4–6.5)
NEUTROS PCT: 83 %
Platelets: 251 10*3/uL (ref 150–440)
RBC: 4.89 MIL/uL (ref 4.40–5.90)
RDW: 13.2 % (ref 11.5–14.5)
WBC: 14.5 10*3/uL — ABNORMAL HIGH (ref 3.8–10.6)

## 2016-08-19 LAB — TROPONIN I: Troponin I: <0.03 ng/mL (ref <0.03)

## 2016-08-19 LAB — BASIC METABOLIC PANEL
ANION GAP: 8 (ref 5–15)
BUN: 12 mg/dL (ref 6–20)
CHLORIDE: 106 mmol/L (ref 101–111)
CO2: 25 mmol/L (ref 22–32)
Calcium: 9.3 mg/dL (ref 8.9–10.3)
Creatinine, Ser: 1.15 mg/dL (ref 0.61–1.24)
GFR calc Af Amer: >60 mL/min (ref >60)
GLUCOSE: 116 mg/dL — AB (ref 65–99)
Potassium: 3.6 mmol/L (ref 3.5–5.1)
Sodium: 139 mmol/L (ref 135–145)

## 2016-08-19 MED ORDER — IPRATROPIUM-ALBUTEROL 0.5-2.5 (3) MG/3ML IN SOLN
3.0000 mL | Freq: Once | RESPIRATORY_TRACT | Status: AC
  Administered 2016-08-19: 3 mL via RESPIRATORY_TRACT
  Filled 2016-08-19: qty 3

## 2016-08-19 MED ORDER — METHYLPREDNISOLONE SODIUM SUCC 125 MG IJ SOLR
125.0000 mg | Freq: Once | INTRAMUSCULAR | Status: AC
  Administered 2016-08-19: 125 mg via INTRAVENOUS
  Filled 2016-08-19: qty 2

## 2016-08-19 MED ORDER — PREDNISONE 20 MG PO TABS
60.0000 mg | ORAL_TABLET | Freq: Every day | ORAL | 0 refills | Status: AC
Start: 2016-08-19 — End: 2016-08-23

## 2016-08-19 MED ORDER — SODIUM CHLORIDE 0.9 % IV BOLUS (SEPSIS)
1000.0000 mL | Freq: Once | INTRAVENOUS | Status: AC
  Administered 2016-08-19: 1000 mL via INTRAVENOUS
  Filled 2016-08-19: qty 1000

## 2016-08-19 MED ORDER — DOXYCYCLINE HYCLATE 100 MG PO TABS
100.0000 mg | ORAL_TABLET | Freq: Once | ORAL | Status: AC
  Administered 2016-08-19: 100 mg via ORAL
  Filled 2016-08-19: qty 1

## 2016-08-19 MED ORDER — ALBUTEROL SULFATE HFA 108 (90 BASE) MCG/ACT IN AERS: 2.0000 | INHALATION_SPRAY | Freq: Four times a day (QID) | RESPIRATORY_TRACT | 2 refills | Status: DC | PRN

## 2016-08-19 MED ORDER — DOXYCYCLINE HYCLATE 100 MG PO CAPS: 100.0000 mg | ORAL_CAPSULE | Freq: Two times a day (BID) | ORAL | 0 refills | Status: AC

## 2016-08-19 MED ORDER — ALBUTEROL SULFATE (2.5 MG/3ML) 0.083% IN NEBU
7.5000 mg | INHALATION_SOLUTION | Freq: Once | RESPIRATORY_TRACT | Status: AC
  Administered 2016-08-19: 7.5 mg via RESPIRATORY_TRACT
  Filled 2016-08-19: qty 9

## 2016-08-19 NOTE — ED Provider Notes (Signed)
Patient is a 31 year old male with a history of asthma who is presenting to the emergency department today with shortness of breath. Receiving steroids as well as nebulizer treatments. Plan is to reevaluate after treatments.  Physical Exam  BP 134/71 (BP Location: Right Arm)   Pulse 94   Temp 98.3 F (36.8 C) (Oral)   Resp 20   Ht  (1.778 m)   Wt 165 lb (74.8 kg)   SpO2 98%   BMI 23.68 kg/m   ----------------------------------------- 9:05 AM on 08/19/2016 -----------------------------------------    Physical Exam Patient without any respiratory distress at this time. No retractions. Scant wheezing throughout. Patient has received 3 DuoNeb as well as 3 albuterol nebulizer treatments.  ED Course  Procedures  MDM Patient saying that he feels like he is at his baseline breathing status. Says he has never been admitted to the hospital or been in ICU for asthma in the past. Will be discharged with albuterol, antibiotics will steroids. He is understanding of the plan for outpatient treatment and will comply.       Myrna Blazer, MD 08/19/16 [phone]

## 2016-08-19 NOTE — ED Provider Notes (Signed)
[facility] Emergency Department Provider Note  ____________________________________________  Time seen: Approximately 6:29 AM  I have reviewed the triage vital signs and the nursing notes.   HISTORY  Chief Complaint Shortness of Breath and Chest Pain   HPI Frank Hendricks is a 31 y.o. male with a history of asthma and active smoking who presents for evaluation of wheezing and shortness of breath. Patient reports 2-3 days of productive cough, generalized body aches, chills, subjective fever, wheezing, and shortness of breath. He does not have a primary care doctor and ran out of his inhalers. He continues to smoke. He denies nausea, vomiting, diarrhea, abdominal pain, chest pain, sore throat. Patient has never been intubated or hospitalized for his asthma before. No personal or family history blood clots or ischemic heart disease.  Past Medical History:  Diagnosis Date  . Asthma   . Migraines     There are no active problems to display for this patient.   No past surgical history on file.  Prior to Admission medications   Medication Sig Start Date End Date Taking? Authorizing Provider  albuterol (PROVENTIL HFA;VENTOLIN HFA) 108 (90 Base) MCG/ACT inhaler Inhale 2 puffs into the lungs every 6 (six) hours as needed for wheezing or shortness of breath. 08/19/16   Nita Sickle, MD  brompheniramine-pseudoephedrine-DM 30-2-10 MG/5ML syrup Take 5 mLs by mouth 4 (four) times daily as needed. 05/10/16   Joni Reining, PA-C  cetirizine (ZYRTEC) 10 MG tablet Take 1 tablet (10 mg total) by mouth daily. 01/26/15   Delorise Royals Cuthriell, PA-C  doxycycline (VIBRAMYCIN) 100 MG capsule Take 1 capsule (100 mg total) by mouth 2 (two) times daily. 08/19/16 08/26/16  Nita Sickle, MD  fluticasone (FLONASE) 50 MCG/ACT nasal spray Place 1 spray into both nostrils 2 (two) times daily. 01/26/15   Delorise Royals Cuthriell, PA-C  hydrOXYzine (VISTARIL) 25 MG capsule Take 1 capsule  (25 mg total) by mouth 3 (three) times daily as needed. 09/16/14   Evangeline Dakin, PA-C  methylPREDNISolone (MEDROL DOSEPAK) 4 MG TBPK tablet Take Tapered dose as directed 05/10/16   Joni Reining, PA-C  predniSONE (DELTASONE) 20 MG tablet Take 3 tablets (60 mg total) by mouth daily. 08/19/16 08/23/16  Nita Sickle, MD  ranitidine (ZANTAC) 150 MG tablet Take 1 tablet (150 mg total) by mouth 2 (two) times daily. 09/16/14   Evangeline Dakin, PA-C    Allergies Patient has no known allergies.  No family history on file.  Social History Social History  Substance Use Topics  . Smoking status: Current Every Day Smoker    Packs/day: 1.50    Years: 12.00    Types: Cigarettes  . Smokeless tobacco: Never Used  . Alcohol use No    Review of Systems  Constitutional: + fever, chills, body aches Eyes: Negative for visual changes. ENT: Negative for sore throat. Neck: No neck pain  Cardiovascular: Negative for chest pain. Respiratory: + shortness of breath, wheezing, and cough Gastrointestinal: Negative for abdominal pain, vomiting or diarrhea. Genitourinary: Negative for dysuria. Musculoskeletal: Negative for back pain. Skin: Negative for rash. Neurological: Negative for headaches, weakness or numbness. Psych: No SI or HI  ____________________________________________   PHYSICAL EXAM:  VITAL SIGNS: ED Triage Vitals  Enc Vitals Group     BP 08/19/16 0204 113/78     Pulse Rate 08/19/16 0204 (!) 123     Resp 08/19/16 0204 20     Temp 08/19/16 0204 98.3 F (36.8 C)  Temp Source 08/19/16 0204 Oral     SpO2 08/19/16 0204 96 %     Weight 08/19/16 0205 165 lb (74.8 kg)     Height 08/19/16 0205  (1.778 m)     Head Circumference --      Peak Flow --      Pain Score 08/19/16 0204 9     Pain Loc --      Pain Edu? --      Excl. in GC? --     Constitutional: Alert and oriented. Well appearing and in no apparent distress. HEENT:      Head: Normocephalic and atraumatic.          Eyes: Conjunctivae are normal. Sclera is non-icteric. EOMI. PERRL      Mouth/Throat: Mucous membranes are moist.       Neck: Supple with no signs of meningismus. Cardiovascular: Tachycardic with regular rhythm. No murmurs, gallops, or rubs. 2+ symmetrical distal pulses are present in all extremities. No JVD. Respiratory: Normal respiratory effort. Decreased air movement bilaterally with diffuse expiratory wheezes Gastrointestinal: Soft, non tender, and non distended with positive bowel sounds. No rebound or guarding. Musculoskeletal: Nontender with normal range of motion in all extremities. No edema, cyanosis, or erythema of extremities. Neurologic: Normal speech and language. Face is symmetric. Moving all extremities. No gross focal neurologic deficits are appreciated. Skin: Skin is warm, dry and intact. No rash noted. Psychiatric: Mood and affect are normal. Speech and behavior are normal.  ____________________________________________   LABS (all labs ordered are listed, but only abnormal results are displayed)  Labs Reviewed  BASIC METABOLIC PANEL - Abnormal; Notable for the following:       Result Value   Glucose, Bld 116 (*)    All other components within normal limits  CBC WITH DIFFERENTIAL/PLATELET - Abnormal; Notable for the following:    WBC 14.5 (*)    Neutro Abs 12.1 (*)    Lymphs Abs 0.8 (*)    Monocytes Absolute 1.1 (*)    All other components within normal limits  TROPONIN I   ____________________________________________  EKG  ED ECG REPORT I, Nita Sickle, the attending physician, personally viewed and interpreted this ECG.  Sinus tachycardia, rate of 116, normal intervals, normal axis, no ST elevations or depressions. Unchanged from prior from March 2018 ____________________________________________  RADIOLOGY  CXR: Negative  ____________________________________________   PROCEDURES  Procedure(s) performed: None Procedures Critical Care  performed:  None ____________________________________________   INITIAL IMPRESSION / ASSESSMENT AND PLAN / ED COURSE  31 y.o. male with a history of asthma and active smoking who presents for evaluation of wheezing, productive cough, shortness of breath, subjective fever and chills. Patient with obvious asthma exacerbation with diffuse expiratory wheezes and decreased air movement. Normal sats. Patient was also tachycardic on arrival at triage which resolved by the time patient was placed in the room without interventions. He is afebrile here. Chest x-ray with no infiltrate however based on clinical presentation, and a white count of 15,000, have opted to treat patient for possible community-acquired pneumonia. We'll give DuoNeb 3, Solu-Medrol, IV fluids, doxycycline, and reassess.   7:00 AM Patient currently receiving duonebs. Plan to reassess after treatment. Care transferred to Dr. Pershing Proud    Pertinent labs & imaging results that were available during my care of the patient were reviewed by me and considered in my medical decision making (see chart for details).    ____________________________________________   FINAL CLINICAL IMPRESSION(S) / ED DIAGNOSES  Final diagnoses:  Exacerbation of asthma, unspecified asthma severity, unspecified whether persistent  Community acquired pneumonia, unspecified laterality      NEW MEDICATIONS STARTED DURING THIS VISIT:  New Prescriptions   ALBUTEROL (PROVENTIL HFA;VENTOLIN HFA) 108 (90 BASE) MCG/ACT INHALER    Inhale 2 puffs into the lungs every 6 (six) hours as needed for wheezing or shortness of breath.   DOXYCYCLINE (VIBRAMYCIN) 100 MG CAPSULE    Take 1 capsule (100 mg total) by mouth 2 (two) times daily.   PREDNISONE (DELTASONE) 20 MG TABLET    Take 3 tablets (60 mg total) by mouth daily.     Note:  This document was prepared using Dragon voice recognition software and may include unintentional dictation errors.    Nita Sickle, MD 08/20/16 2108

## 2016-08-19 NOTE — ED Notes (Signed)
ED Provider at bedside. 

## 2016-08-19 NOTE — ED Triage Notes (Signed)
Pt presents to ED with cough and sob with generalized chest pain. Pt reports that he had cold like symptoms yesterday and today symptoms worsened. Attempted to use nebulizer at home with no relief. Inhalers are empty.

## 2016-08-19 NOTE — Discharge Instructions (Signed)

## 2017-05-25 ENCOUNTER — Emergency Department
Admission: EM | Admit: 2017-05-25 | Discharge: 2017-05-25 | Disposition: A | Discharge status: Home / Self Care | Payer: Self-pay | Attending: Emergency Medicine | Admitting: Emergency Medicine

## 2017-05-25 ENCOUNTER — Other Ambulatory Visit: Payer: Self-pay

## 2017-05-25 ENCOUNTER — Emergency Department: Payer: Self-pay

## 2017-05-25 ENCOUNTER — Encounter: Payer: Self-pay | Admitting: Emergency Medicine

## 2017-05-25 DIAGNOSIS — J189 Pneumonia, unspecified organism: Secondary | ICD-10-CM

## 2017-05-25 DIAGNOSIS — Z79899 Other long term (current) drug therapy: Secondary | ICD-10-CM | POA: Insufficient documentation

## 2017-05-25 DIAGNOSIS — J181 Lobar pneumonia, unspecified organism: Secondary | ICD-10-CM | POA: Insufficient documentation

## 2017-05-25 DIAGNOSIS — F17210 Nicotine dependence, cigarettes, uncomplicated: Secondary | ICD-10-CM | POA: Insufficient documentation

## 2017-05-25 DIAGNOSIS — J45909 Unspecified asthma, uncomplicated: Secondary | ICD-10-CM | POA: Insufficient documentation

## 2017-05-25 MED ORDER — DOXYCYCLINE HYCLATE 100 MG PO CAPS: ORAL_CAPSULE | ORAL | 0 refills | Status: AC

## 2017-05-25 MED ORDER — ALBUTEROL SULFATE HFA 108 (90 BASE) MCG/ACT IN AERS: INHALATION_SPRAY | RESPIRATORY_TRACT | 1 refills | Status: AC

## 2017-05-25 NOTE — Discharge Instructions (Signed)
We believe that your symptoms are caused today by pneumonia, an infection in your lung(s).  Fortunately you should start to improve quickly after taking your antibiotics.  Please take the full course of antibiotics as prescribed and drink plenty of fluids.      Follow up with your doctor within 1-2 days as needed.  Please also know that the radiologist recommends that you have a follow up 2-view chest x-ray (like you did today) in 3-4 weeks after you complete your antibiotics to make sure the area of infection has gone away.  If you develop any new or worsening symptoms, including but not limited to fever in spite of taking over-the-counter ibuprofen and/or Tylenol, persistent vomiting, worsening shortness of breath, or other symptoms that concern you, please return to the Emergency Department immediately.

## 2017-05-25 NOTE — ED Notes (Signed)
First Nurse Note: Patient to radiology. 

## 2017-05-25 NOTE — ED Triage Notes (Signed)
Pt to ED via POV. Pt states that he is having shortness of breath. Pt states that he has been having this problem for "a while" Pt states that it has been worse since he has been smoking cigarettes. Pt states that he has been having cough and this morning he woke up with a headache. Pt in NAD at this time, able to speak in complete sentences.

## 2017-05-25 NOTE — ED Provider Notes (Signed)
[facility] Emergency Department Provider Note  ____________________________________________   First MD Initiated Contact with Patient 05/25/17 [phone]     (approximate)  I have reviewed the triage vital signs and the nursing notes.   HISTORY  Chief Complaint Shortness of Breath    HPI Frank Hendricks is a 32 y.o. male with no contributory past medical history except for tobacco use and a possible childhood history of asthma who presents for evaluation of worsening shortness of breath of the last couple of days.  He states that he has been noticing some wheezing and increased guilty breathing particularly with exertion over the last few days.  He has been trying to smoke less than usual but still smokes every day.  He has occasionally felt hot like he may have a fever but has not checked it.  He denies chills, chest pain, nausea, vomiting, and abdominal pain.  He has felt just generally ill and not as well as usual over the last few days but is still in no distress and reports that his symptoms are moderate.  Past Medical History:  Diagnosis Date  . Asthma   . Migraines     There are no active problems to display for this patient.   History reviewed. No pertinent surgical history.  Prior to Admission medications   Medication Sig Start Date End Date Taking? Authorizing Provider  albuterol (PROVENTIL HFA;VENTOLIN HFA) 108 (90 Base) MCG/ACT inhaler Inhale 2-4 puffs by mouth every 4 hours as needed for wheezing, cough, and/or shortness of breath 05/25/17   Loleta RoseForbach, Aniyiah Zell, MD  brompheniramine-pseudoephedrine-DM 30-2-10 MG/5ML syrup Take 5 mLs by mouth 4 (four) times daily as needed. 05/10/16   Joni ReiningSmith, Ronald K, PA-C  cetirizine (ZYRTEC) 10 MG tablet Take 1 tablet (10 mg total) by mouth daily. 01/26/15   Cuthriell, Delorise RoyalsJonathan D, PA-C  doxycycline (VIBRAMYCIN) 100 MG capsule Take 1 capsule (100 mg) by mouth twice daily for 10 days. 05/25/17   Loleta RoseForbach, Adalena Abdulla, MD    fluticasone (FLONASE) 50 MCG/ACT nasal spray Place 1 spray into both nostrils 2 (two) times daily. 01/26/15   Cuthriell, Delorise RoyalsJonathan D, PA-C  hydrOXYzine (VISTARIL) 25 MG capsule Take 1 capsule (25 mg total) by mouth 3 (three) times daily as needed. 09/16/14   Beers, Charmayne Sheerharles M, PA-C  methylPREDNISolone (MEDROL DOSEPAK) 4 MG TBPK tablet Take Tapered dose as directed 05/10/16   Joni ReiningSmith, Ronald K, PA-C  ranitidine (ZANTAC) 150 MG tablet Take 1 tablet (150 mg total) by mouth 2 (two) times daily. 09/16/14   Beers, Charmayne Sheerharles M, PA-C    Allergies Patient has no known allergies.  No family history on file.  Social History Social History   Tobacco Use  . Smoking status: Current Every Day Smoker    Packs/day: 1.50    Years: 12.00    Pack years: 18.00    Types: Cigarettes  . Smokeless tobacco: Never Used  Substance Use Topics  . Alcohol use: No  . Drug use: Yes    Types: Marijuana    Comment: uses every day    Review of Systems Constitutional: Subjective fever.  Mild general malaise Eyes: No visual changes. ENT: No sore throat nor nasal congestion Cardiovascular: Denies chest pain. Respiratory: Dyspnea as described above Gastrointestinal: No abdominal pain.  No nausea, no vomiting.  No diarrhea.  No constipation. Genitourinary: Negative for dysuria. Musculoskeletal: Negative for neck pain.  Negative for back pain. Integumentary: Negative for rash. Neurological: Negative for headaches, focal weakness or numbness.  ____________________________________________   PHYSICAL EXAM:  VITAL SIGNS: ED Triage Vitals [05/25/17 1334]  Enc Vitals Group     BP 133/66     Pulse Rate (!) 113     Resp 15     Temp 98.1 F (36.7 C)     Temp Source Oral     SpO2 99 %     Weight      Height      Head Circumference      Peak Flow      Pain Score      Pain Loc      Pain Edu?      Excl. in GC?     Constitutional: Alert and oriented. Well appearing and in no acute distress. Eyes: Conjunctivae  are normal.  Head: Atraumatic. Nose: No congestion/rhinnorhea. Mouth/Throat: Mucous membranes are moist. Neck: No stridor.  No meningeal signs.   Cardiovascular: Mild tachycardia, regular rhythm. Good peripheral circulation. Grossly normal heart sounds. Respiratory: Normal respiratory effort.  No retractions.  Mild Tory wheezing throughout lung fields.  No coarse breath sounds.   Gastrointestinal: Soft and nontender. No distention.  Musculoskeletal: No lower extremity tenderness nor edema. No gross deformities of extremities. Neurologic:  Normal speech and language. No gross focal neurologic deficits are appreciated.  Skin:  Skin is warm, dry and intact. No rash noted. Psychiatric: Mood and affect are normal. Speech and behavior are normal.  ____________________________________________   LABS (all labs ordered are listed, but only abnormal results are displayed)  Labs Reviewed - No data to display ____________________________________________  EKG  ED ECG REPORT I, Loleta Rose, the attending physician, personally viewed and interpreted this ECG.  Date: 05/25/2017 EKG Time: 13: 36 Rate: 99 Rhythm: Borderline sinus tachycardia QRS Axis: normal Intervals: normal ST/T Wave abnormalities: normal Narrative Interpretation: no evidence of acute ischemia  ____________________________________________  RADIOLOGY I, Loleta Rose, personally viewed and evaluated these images (plain radiographs) as part of my medical decision making, as well as reviewing the written report by the radiologist.  ED MD interpretation: Nodular opacity most likely in the right middle lobe  Official radiology report(s): Dg Chest 2 View  Result Date: 05/25/2017 CLINICAL DATA:  Shortness of breath for 2-3 days EXAM: CHEST  2 VIEW COMPARISON:  08/19/2016 FINDINGS: There is a nodular area of airspace disease in the lingula versus right middle lobe concerning for pneumonia. There is no pleural effusion or  pneumothorax. The heart and mediastinal contours are unremarkable. The osseous structures are unremarkable. IMPRESSION: Nodular area of airspace disease in the lingula versus right middle lobe concerning for pneumonia. Followup PA and lateral chest X-ray is recommended in 3-4 weeks following trial of antibiotic therapy to ensure resolution and exclude underlying malignancy. Electronically Signed   By: Elige Ko   On: 05/25/2017 14:14    ____________________________________________   PROCEDURES  Critical Care performed: No   Procedure(s) performed:   Procedures   ____________________________________________   INITIAL IMPRESSION / ASSESSMENT AND PLAN / ED COURSE  As part of my medical decision making, I reviewed the following data within the electronic MEDICAL RECORD NUMBER Nursing notes reviewed and incorporated, EKG interpreted  and Radiograph reviewed     Differential diagnosis includes, but is not limited to, community-acquired pneumonia, obstructive lung disease such as COPD or asthma, viral respiratory infection, PE, ACS, neoplasm.  The patient is young and otherwise healthy.  His vital signs are notable for some mild tachycardia which is likely attributable to community-acquired pneumonia.  His chest x-ray suggests  pneumonia and that fits clinically with his history of present illness and symptoms.  We discussed his smoking and I counseled smoking cessation.  I explained that most likely he has a mild pneumonia and encouraged him to take the full course of treatment.  I explained in the written instructions that etiology recommends that he have a repeat chest x-ray in 3-4 weeks.  I do not feel there is a role for labs at this time given that he is well-appearing and in no acute distress, afebrile and not hypoxemic, but  I gave my usual and customary return precautions.  He understands and agrees with the plan.     ____________________________________________  FINAL CLINICAL  IMPRESSION(S) / ED DIAGNOSES  Final diagnoses:  Community acquired pneumonia of right middle lobe of lung (HCC)     MEDICATIONS GIVEN DURING THIS VISIT:  Medications - No data to display   ED Discharge Orders        Ordered    doxycycline (VIBRAMYCIN) 100 MG capsule     05/25/17 1713    albuterol (PROVENTIL HFA;VENTOLIN HFA) 108 (90 Base) MCG/ACT inhaler     05/25/17 1714       Note:  This document was prepared using Dragon voice recognition software and may include unintentional dictation errors.    Loleta Rose, MD 05/25/17 1728

## 2017-08-03 ENCOUNTER — Encounter: Payer: Self-pay | Admitting: Emergency Medicine

## 2017-08-03 ENCOUNTER — Emergency Department
Admission: EM | Admit: 2017-08-03 | Discharge: 2017-08-03 | Disposition: A | Discharge status: Home / Self Care | Payer: Self-pay | Attending: Emergency Medicine | Admitting: Emergency Medicine

## 2017-08-03 ENCOUNTER — Emergency Department: Payer: Self-pay

## 2017-08-03 ENCOUNTER — Other Ambulatory Visit: Payer: Self-pay

## 2017-08-03 DIAGNOSIS — F17210 Nicotine dependence, cigarettes, uncomplicated: Secondary | ICD-10-CM | POA: Insufficient documentation

## 2017-08-03 DIAGNOSIS — J4521 Mild intermittent asthma with (acute) exacerbation: Secondary | ICD-10-CM | POA: Insufficient documentation

## 2017-08-03 MED ORDER — PREDNISONE 20 MG PO TABS
60.0000 mg | ORAL_TABLET | Freq: Once | ORAL | Status: AC
  Administered 2017-08-03: 60 mg via ORAL
  Filled 2017-08-03: qty 3

## 2017-08-03 MED ORDER — IPRATROPIUM-ALBUTEROL 0.5-2.5 (3) MG/3ML IN SOLN
3.0000 mL | Freq: Once | RESPIRATORY_TRACT | Status: AC
  Administered 2017-08-03: 3 mL via RESPIRATORY_TRACT
  Filled 2017-08-03: qty 3

## 2017-08-03 MED ORDER — PREDNISONE 50 MG PO TABS: ORAL_TABLET | ORAL | 0 refills | Status: DC

## 2017-08-03 MED ORDER — ALBUTEROL SULFATE HFA 108 (90 BASE) MCG/ACT IN AERS: 2.0000 | INHALATION_SPRAY | Freq: Four times a day (QID) | RESPIRATORY_TRACT | 2 refills | Status: AC | PRN

## 2017-08-03 NOTE — ED Triage Notes (Signed)
Pt states he has been having shob since 2330 last pm, states it has worsened this am, inspiratory wheezing heard, pt states he smokes cigarets and that is why he can't breathe.

## 2017-08-03 NOTE — ED Notes (Signed)
Patient transported to X-ray 

## 2017-08-03 NOTE — ED Provider Notes (Signed)
[facility] Emergency Department Provider Note       Time seen: ----------------------------------------- 8:09 AM on 08/03/2017 -----------------------------------------   I have reviewed the triage vital signs and the nursing notes.  HISTORY   Chief Complaint Shortness of Breath    HPI Frank Hendricks is a 32 y.o. male with a history of asthma and migraines who presents to the ED for shortness of breath that began around midnight last night.  Patient states it has worsened this morning, he was brought in to the ER with audible wheezing auscultated.  Patient states the reason he wheezes is because he smokes cigarettes.  He denies fevers, chills or other complaints, patient states he does not have seasonal allergy.  Past Medical History:  Diagnosis Date  . Asthma   . Migraines     There are no active problems to display for this patient.   History reviewed. No pertinent surgical history.  Allergies Patient has no known allergies.  Social History Social History   Tobacco Use  . Smoking status: Current Every Day Smoker    Packs/day: 1.50    Years: 12.00    Pack years: 18.00    Types: Cigarettes  . Smokeless tobacco: Never Used  Substance Use Topics  . Alcohol use: No  . Drug use: Yes    Types: Marijuana    Comment: uses every day   Review of Systems Constitutional: Negative for fever. ENT:  Negative for congestion, sore throat Cardiovascular: Negative for chest pain. Respiratory: Positive for wheezing and shortness of breath Musculoskeletal: Negative for back pain. Skin: Negative for rash. Neurological: Negative for headaches, focal weakness or numbness.  All systems negative/normal/unremarkable except as stated in the HPI  ____________________________________________   PHYSICAL EXAM:  VITAL SIGNS: ED Triage Vitals  Enc Vitals Group     BP 08/03/17 0802 (!) 114/99     Pulse Rate 08/03/17 0802 81     Resp 08/03/17 0802 20     Temp 08/03/17 0802 (!) 97.5 F (36.4 C)     Temp Source 08/03/17 0802 Oral     SpO2 08/03/17 0802 97 %     Weight 08/03/17 0804 170 lb (77.1 kg)     Height 08/03/17 0804 5\' 10"  (1.778 m)     Head Circumference --      Peak Flow --      Pain Score 08/03/17 0804 0     Pain Loc --      Pain Edu? --      Excl. in GC? --    Constitutional: Alert and oriented. Well appearing and in no distress. Eyes: Conjunctivae are normal. Normal extraocular movements. ENT   Head: Normocephalic and atraumatic.   Nose: No congestion/rhinnorhea.   Mouth/Throat: Mucous membranes are moist.   Neck: No stridor. Cardiovascular: Normal rate, regular rhythm. No murmurs, rubs, or gallops. Respiratory: Bilateral wheezing is noted Musculoskeletal: Nontender with normal range of motion in extremities. No lower extremity tenderness nor edema. Neurologic:  Normal speech and language. No gross focal neurologic deficits are appreciated.  Skin:  Skin is warm, dry and intact. No rash noted. ____________________________________________  ED COURSE:  As part of my medical decision making, I reviewed the following data within the electronic MEDICAL RECORD NUMBER History obtained from family if available, nursing notes, old chart and ekg, as well as notes from prior ED visits. Patient presented for shortness of breath and apparent asthma exacerbation, patient will receive a DuoNeb and steroids and we will assess  with a chest x-ray   Procedures ____________________________________________   RADIOLOGY  Chest x-ray Is unremarkable ____________________________________________  DIFFERENTIAL DIAGNOSIS   Asthma, bronchitis, URI, seasonal allergy, pneumonia  FINAL ASSESSMENT AND PLAN  Asthma exacerbation   Plan: The patient had presented for wheezing. Patient's imaging was unremarkable.  He received duo nebs and steroids.  Patient admits to running out of his albuterol inhaler.  Have advised him that he does  indeed have asthma and that he will occasionally need to have an albuterol inhaler.  He is stable for outpatient follow-up.   Ulice Dash, MD   Note: This note was generated in part or whole with voice recognition software. Voice recognition is usually quite accurate but there are transcription errors that can and very often do occur. I apologize for any typographical errors that were not detected and corrected.     Emily Filbert, MD 08/03/17 [phone]

## 2017-09-12 ENCOUNTER — Encounter: Payer: Self-pay | Admitting: Emergency Medicine

## 2017-09-12 ENCOUNTER — Emergency Department
Admission: EM | Admit: 2017-09-12 | Discharge: 2017-09-12 | Disposition: A | Discharge status: Home / Self Care | Payer: Self-pay | Attending: Internal Medicine | Admitting: Internal Medicine

## 2017-09-12 ENCOUNTER — Other Ambulatory Visit: Payer: Self-pay

## 2017-09-12 ENCOUNTER — Emergency Department: Payer: Self-pay

## 2017-09-12 DIAGNOSIS — Z79899 Other long term (current) drug therapy: Secondary | ICD-10-CM | POA: Insufficient documentation

## 2017-09-12 DIAGNOSIS — F17210 Nicotine dependence, cigarettes, uncomplicated: Secondary | ICD-10-CM | POA: Insufficient documentation

## 2017-09-12 DIAGNOSIS — J45901 Unspecified asthma with (acute) exacerbation: Secondary | ICD-10-CM | POA: Insufficient documentation

## 2017-09-12 MED ORDER — PREDNISONE 10 MG PO TABS: ORAL_TABLET | ORAL | 0 refills | Status: AC

## 2017-09-12 MED ORDER — IPRATROPIUM-ALBUTEROL 0.5-2.5 (3) MG/3ML IN SOLN
3.0000 mL | Freq: Once | RESPIRATORY_TRACT | Status: AC
  Administered 2017-09-12: 3 mL via RESPIRATORY_TRACT
  Filled 2017-09-12: qty 3

## 2017-09-12 MED ORDER — PREDNISONE 20 MG PO TABS
60.0000 mg | ORAL_TABLET | Freq: Once | ORAL | Status: AC
  Administered 2017-09-12: 60 mg via ORAL
  Filled 2017-09-12: qty 3

## 2017-09-12 MED ORDER — BENZONATATE 100 MG PO CAPS: 100.0000 mg | ORAL_CAPSULE | Freq: Three times a day (TID) | ORAL | 0 refills | Status: AC | PRN

## 2017-09-12 NOTE — ED Triage Notes (Signed)
Cough x 2 days. Unknown if had fevers, no sore throat.

## 2017-09-12 NOTE — ED Notes (Signed)
See triage note. Presents with prod cough and subjective fever  States he is having some discomfort in chest

## 2017-09-12 NOTE — Discharge Instructions (Signed)
Start prednisone prescription tomorrow.

## 2017-09-12 NOTE — ED Provider Notes (Signed)
[facility] Emergency Department Provider Note  ____________________________________________  Time seen: Approximately 4:07 PM  I have reviewed the triage vital signs and the nursing notes.   HISTORY  Chief Complaint Cough    HPI Frank Hendricks is a 32 y.o. male that presents to the emergency department for evaluation of cough and shortness of breath for 2 days.  He has been using his inhaler, without relief.  Patient states that he has a history of asthma and is frequently has flare ups.  He smokes a pack cigarettes per day.  He is unsure of fever but has had chills.  No nasal congestion, nausea, vomiting.  Past Medical History:  Diagnosis Date  . Asthma   . Migraines     There are no active problems to display for this patient.   No past surgical history on file.  Prior to Admission medications   Medication Sig Start Date End Date Taking? Authorizing Provider  albuterol (PROVENTIL HFA;VENTOLIN HFA) 108 (90 Base) MCG/ACT inhaler Inhale 2-4 puffs by mouth every 4 hours as needed for wheezing, cough, and/or shortness of breath 05/25/17   Loleta Rose, MD  albuterol (PROVENTIL HFA;VENTOLIN HFA) 108 (90 Base) MCG/ACT inhaler Inhale 2 puffs into the lungs every 6 (six) hours as needed for wheezing or shortness of breath. 08/03/17   Emily Filbert, MD  benzonatate (TESSALON PERLES) 100 MG capsule Take 1 capsule (100 mg total) by mouth 3 (three) times daily as needed for cough. 09/12/17 09/12/18  Enid Derry, PA-C  brompheniramine-pseudoephedrine-DM 30-2-10 MG/5ML syrup Take 5 mLs by mouth 4 (four) times daily as needed. 05/10/16   Joni Reining, PA-C  cetirizine (ZYRTEC) 10 MG tablet Take 1 tablet (10 mg total) by mouth daily. 01/26/15   Cuthriell, Delorise Royals, PA-C  doxycycline (VIBRAMYCIN) 100 MG capsule Take 1 capsule (100 mg) by mouth twice daily for 10 days. 05/25/17   Loleta Rose, MD  fluticasone (FLONASE) 50 MCG/ACT nasal spray Place 1 spray into  both nostrils 2 (two) times daily. 01/26/15   Cuthriell, Delorise Royals, PA-C  hydrOXYzine (VISTARIL) 25 MG capsule Take 1 capsule (25 mg total) by mouth 3 (three) times daily as needed. 09/16/14   Beers, Charmayne Sheer, PA-C  methylPREDNISolone (MEDROL DOSEPAK) 4 MG TBPK tablet Take Tapered dose as directed 05/10/16   Joni Reining, PA-C  predniSONE (DELTASONE) 10 MG tablet Take 6 tablets on day 1, take 5 tablets on day 2, take 4 tablets on day 3, take 3 tablets on day 4, take 2 tablets on day 5, take 1 tablet on day 6 09/12/17   Enid Derry, PA-C  ranitidine (ZANTAC) 150 MG tablet Take 1 tablet (150 mg total) by mouth 2 (two) times daily. 09/16/14   Beers, Charmayne Sheer, PA-C    Allergies Patient has no known allergies.  No family history on file.  Social History Social History   Tobacco Use  . Smoking status: Current Every Day Smoker    Packs/day: 1.00    Years: 12.00    Pack years: 12.00    Types: Cigarettes  . Smokeless tobacco: Never Used  Substance Use Topics  . Alcohol use: No  . Drug use: Yes    Types: Marijuana    Comment: uses every day     Review of Systems  Eyes: No visual changes. No discharge. ENT: Negative for congestion and rhinorrhea. Cardiovascular: No chest pain. Respiratory: Positive for cough and SOB. Gastrointestinal: No abdominal pain.  No nausea, no vomiting.  No diarrhea.  No constipation. Musculoskeletal: Negative for musculoskeletal pain. Skin: Negative for rash, abrasions, lacerations, ecchymosis. Neurological: Negative for headaches.   ____________________________________________   PHYSICAL EXAM:  VITAL SIGNS: ED Triage Vitals [09/12/17 1520]  Enc Vitals Group     BP 123/79     Pulse Rate 97     Resp 20     Temp 98.7 F (37.1 C)     Temp Source Oral     SpO2 98 %     Weight 165 lb (74.8 kg)     Height  (1.778 m)     Head Circumference      Peak Flow      Pain Score 0     Pain Loc      Pain Edu?      Excl. in GC?       Constitutional: Alert and oriented. Well appearing and in no acute distress. Eyes: Conjunctivae are normal. PERRL. EOMI. No discharge. Head: Atraumatic. ENT: No frontal and maxillary sinus tenderness.      Ears: Tympanic membranes pearly gray with good landmarks. No discharge.      Nose: No congestion/rhinnorhea.      Mouth/Throat: Mucous membranes are moist. Oropharynx non-erythematous. Tonsils not enlarged. No exudates. Uvula midline. Neck: No stridor.   Hematological/Lymphatic/Immunilogical: No cervical lymphadenopathy. Cardiovascular: Normal rate, regular rhythm.  Good peripheral circulation. Respiratory: Normal respiratory effort without tachypnea or retractions. Lungs CTAB. Good air entry to the bases with no decreased or absent breath sounds. Gastrointestinal: Bowel sounds 4 quadrants. Soft and nontender to palpation. No guarding or rigidity. No palpable masses. No distention. Musculoskeletal: Full range of motion to all extremities. No gross deformities appreciated. Neurologic:  Normal speech and language. No gross focal neurologic deficits are appreciated.  Skin:  Skin is warm, dry and intact. No rash noted. Psychiatric: Mood and affect are normal. Speech and behavior are normal. Patient exhibits appropriate insight and judgement.   ____________________________________________   LABS (all labs ordered are listed, but only abnormal results are displayed)  Labs Reviewed - No data to display ____________________________________________  EKG   ____________________________________________  RADIOLOGY Lexine Baton, personally viewed and evaluated these images (plain radiographs) as part of my medical decision making, as well as reviewing the written report by the radiologist.  Dg Chest 2 View  Result Date: 09/12/2017 CLINICAL DATA:  Productive cough, fever. EXAM: CHEST - 2 VIEW COMPARISON:  Radiographs of August 03, 2017. FINDINGS: The heart size and mediastinal  contours are within normal limits. Both lungs are clear. No pneumothorax or pleural effusion is noted. The visualized skeletal structures are unremarkable. IMPRESSION: No active cardiopulmonary disease. Electronically Signed   By: Lupita Raider, M.D.   On: 09/12/2017 16:34    ____________________________________________    PROCEDURES  Procedure(s) performed:    Procedures    Medications  ipratropium-albuterol (DUONEB) 0.5-2.5 (3) MG/3ML nebulizer solution 3 mL (3 mLs Nebulization Given 09/12/17 1614)  predniSONE (DELTASONE) tablet 60 mg (60 mg Oral Given 09/12/17 1703)     ____________________________________________   INITIAL IMPRESSION / ASSESSMENT AND PLAN / ED COURSE  Pertinent labs & imaging results that were available during my care of the patient were reviewed by me and considered in my medical decision making (see chart for details).  Review of the [place] CSRS was performed in accordance of the [facility] prior to dispensing any controlled drugs.     Patient's diagnosis is consistent with asthma exacerbation. Vital signs and exam are reassuring.  Chest  x-ray negative for acute cardiopulmonary processes.  Patient appears well and is staying well hydrated. Patient feels comfortable going home. Patient will be discharged home with prescriptions for prednisone and Tessalon Perles.  Patient is to follow up with PCP as needed or otherwise directed. Patient is given ED precautions to return to the ED for any worsening or new symptoms.     ____________________________________________  FINAL CLINICAL IMPRESSION(S) / ED DIAGNOSES  Final diagnoses:  Exacerbation of asthma, unspecified asthma severity, unspecified whether persistent      NEW MEDICATIONS STARTED DURING THIS VISIT:  ED Discharge Orders        Ordered    predniSONE (DELTASONE) 10 MG tablet     09/12/17 1652    benzonatate (TESSALON PERLES) 100 MG capsule  3 times daily PRN     09/12/17 1652           This chart was dictated using voice recognition software/Dragon. Despite best efforts to proofread, errors can occur which can change the meaning. Any change was purely unintentional. 8   Enid Derry, PA-C 09/12/17 1714    Emily Filbert, MD 09/15/17 [phone]

## 2018-06-23 IMAGING — CR DG CHEST 2V
2 series · 2 of 2 positions shown · non-contrast
Comparison: 05/25/2017

CLINICAL DATA: Shortness of Breath starting last night and
worsening this morning. Inspiratory wheezing.

EXAM:
CHEST - 2 VIEW

[chest pa]
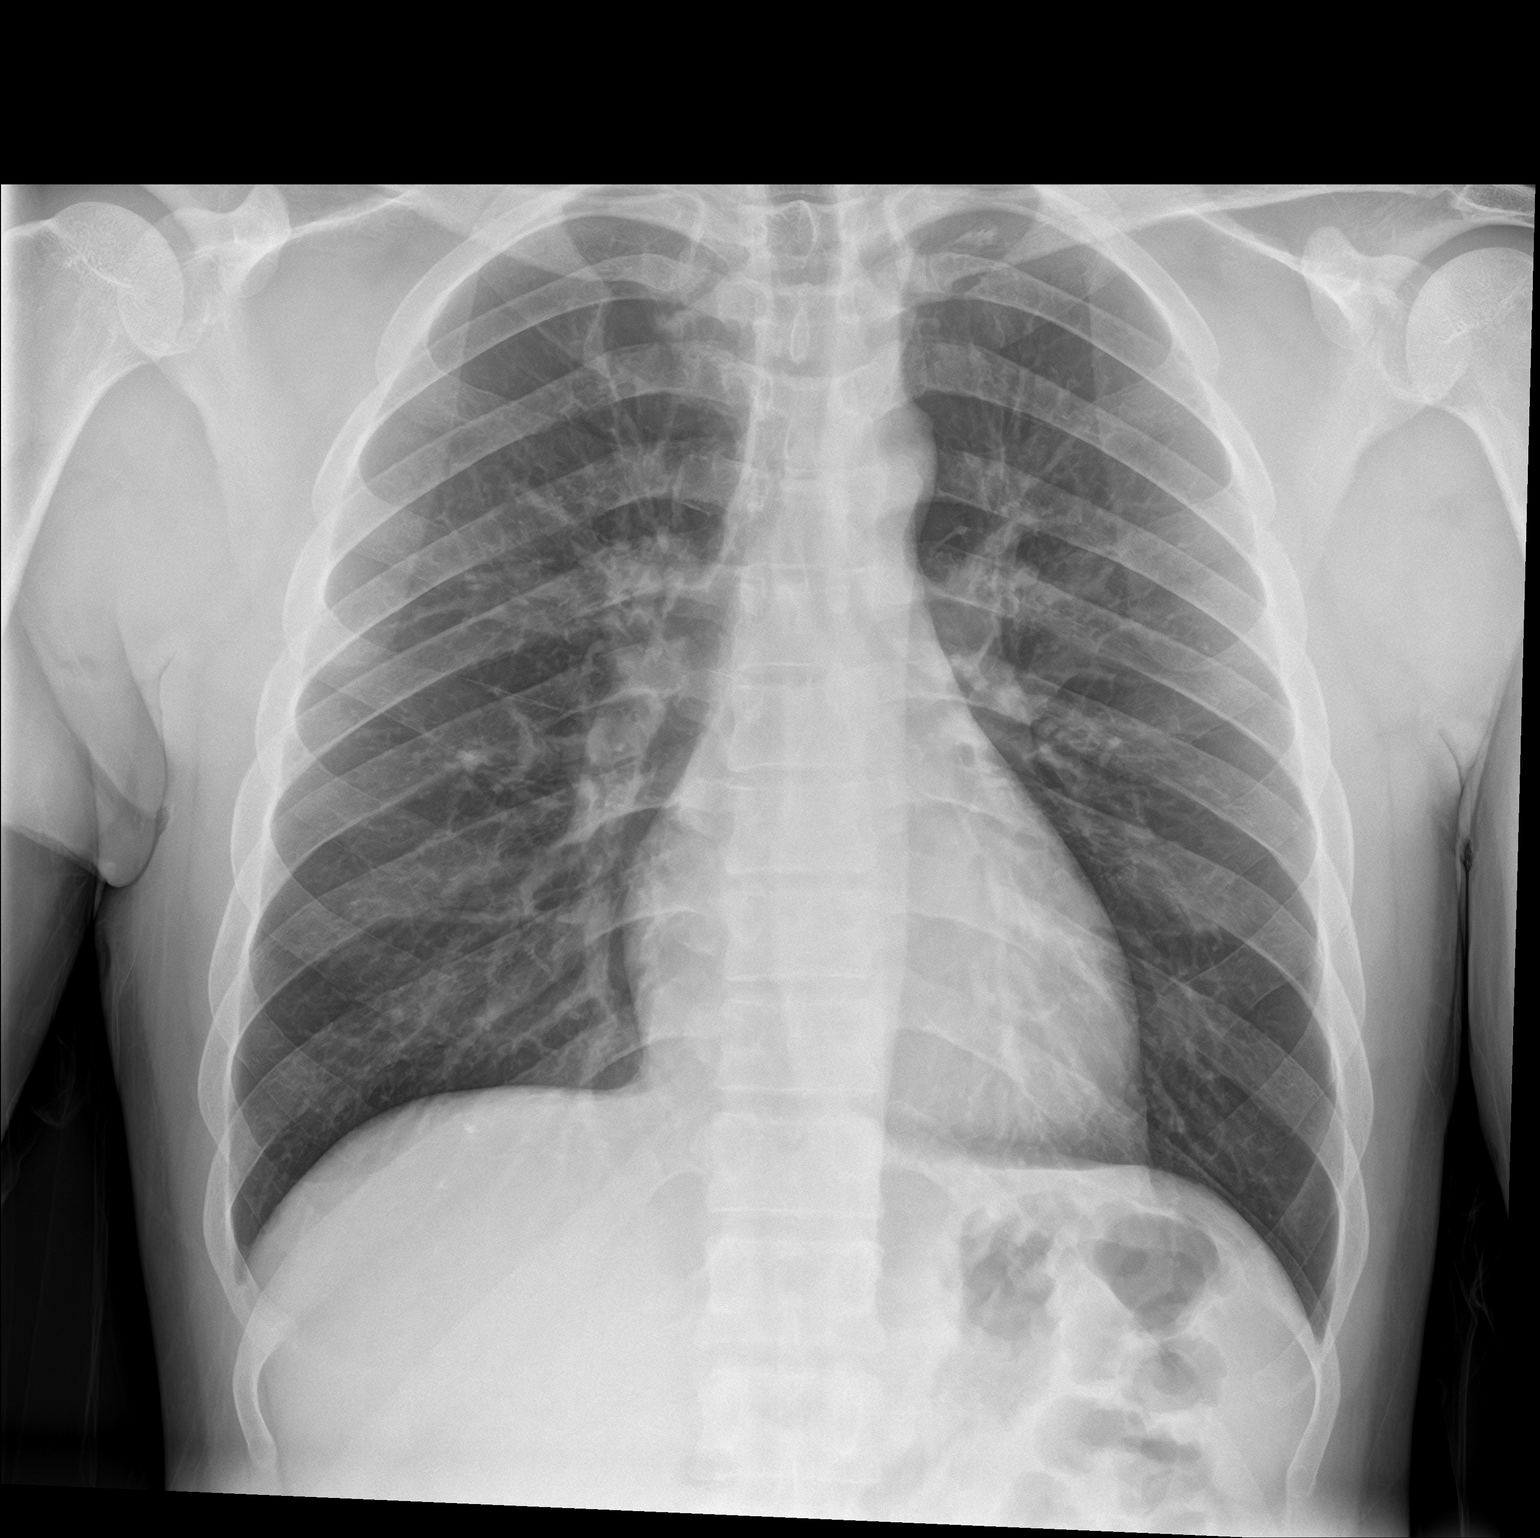

[chest lat]
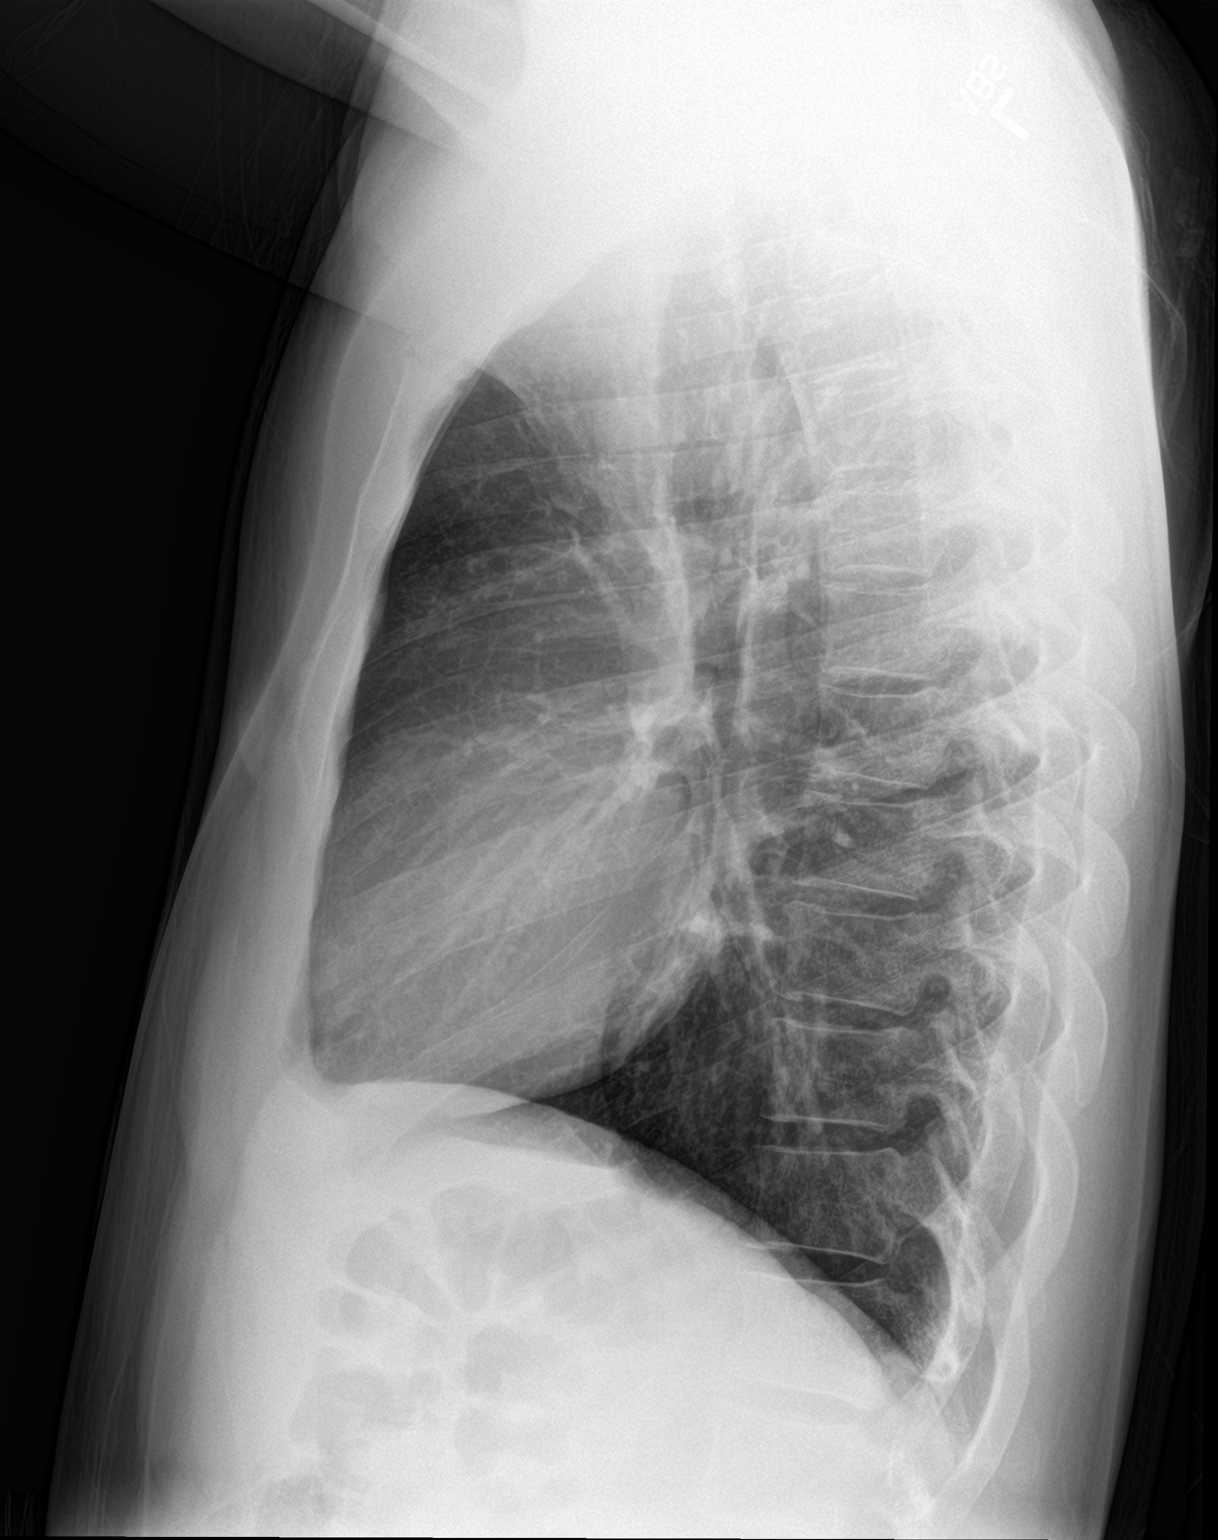

[2 of 2 positions shown; findings below may reference images not displayed]

FINDINGS: Central airway thickening. Cardiac and mediastinal margins appear
normal. No pleural effusion. Slight nodularity in the right mid lung
may be from vascular superimposition and is stable from 3707 hence
considered benign. The previously seen vague nodularity projecting
anteriorly over the lung bases seems to have resolved.
IMPRESSION: 1. Airway thickening is present, suggesting bronchitis or reactive
airways disease.

## 2019-07-24 ENCOUNTER — Ambulatory Visit: Payer: Self-pay

## 2019-08-14 ENCOUNTER — Ambulatory Visit: Payer: Self-pay
